# Patient Record
Sex: Male | Born: 1935 | Race: White | Hispanic: No | Marital: Married | Smoking: Former smoker
Health system: Southern US, Community
[De-identification: ages and names within clinical notes are randomized; demographics above are authoritative.]

## PROBLEM LIST (undated history)

## (undated) DIAGNOSIS — R0600 Dyspnea, unspecified: Secondary | ICD-10-CM

## (undated) DIAGNOSIS — R059 Cough, unspecified: Secondary | ICD-10-CM

## (undated) DIAGNOSIS — J189 Pneumonia, unspecified organism: Secondary | ICD-10-CM

## (undated) DIAGNOSIS — I10 Essential (primary) hypertension: Secondary | ICD-10-CM

## (undated) DIAGNOSIS — M1990 Unspecified osteoarthritis, unspecified site: Secondary | ICD-10-CM

## (undated) DIAGNOSIS — J45909 Unspecified asthma, uncomplicated: Secondary | ICD-10-CM

## (undated) DIAGNOSIS — J449 Chronic obstructive pulmonary disease, unspecified: Secondary | ICD-10-CM

## (undated) DIAGNOSIS — N189 Chronic kidney disease, unspecified: Secondary | ICD-10-CM

## (undated) DIAGNOSIS — C801 Malignant (primary) neoplasm, unspecified: Secondary | ICD-10-CM

## (undated) DIAGNOSIS — R05 Cough: Secondary | ICD-10-CM

## (undated) HISTORY — PX: HERNIA REPAIR: SHX51

## (undated) HISTORY — PX: EYE SURGERY: SHX253

## (undated) HISTORY — PX: COLON SURGERY: SHX602

## (undated) HISTORY — PX: PROSTATECTOMY: SHX69

---

## 1980-01-27 DIAGNOSIS — C189 Malignant neoplasm of colon, unspecified: Secondary | ICD-10-CM

## 1980-01-27 HISTORY — DX: Malignant neoplasm of colon, unspecified: C18.9

## 1998-01-26 DIAGNOSIS — C61 Malignant neoplasm of prostate: Secondary | ICD-10-CM

## 1998-01-26 HISTORY — DX: Malignant neoplasm of prostate: C61

## 2004-03-27 ENCOUNTER — Ambulatory Visit: Payer: Self-pay | Admitting: Gastroenterology

## 2004-06-05 ENCOUNTER — Ambulatory Visit: Payer: Self-pay | Admitting: Internal Medicine

## 2009-01-21 ENCOUNTER — Ambulatory Visit: Payer: Self-pay | Admitting: Surgery

## 2009-02-07 ENCOUNTER — Ambulatory Visit: Payer: Self-pay | Admitting: Surgery

## 2009-03-14 ENCOUNTER — Ambulatory Visit: Payer: Self-pay | Admitting: Internal Medicine

## 2009-03-16 ENCOUNTER — Ambulatory Visit: Payer: Self-pay | Admitting: Internal Medicine

## 2009-07-22 ENCOUNTER — Ambulatory Visit: Payer: Self-pay | Admitting: Gastroenterology

## 2011-03-09 DIAGNOSIS — J40 Bronchitis, not specified as acute or chronic: Secondary | ICD-10-CM | POA: Insufficient documentation

## 2012-08-17 DIAGNOSIS — N4340 Spermatocele of epididymis, unspecified: Secondary | ICD-10-CM | POA: Insufficient documentation

## 2013-01-04 DIAGNOSIS — E785 Hyperlipidemia, unspecified: Secondary | ICD-10-CM | POA: Insufficient documentation

## 2013-01-04 DIAGNOSIS — J449 Chronic obstructive pulmonary disease, unspecified: Secondary | ICD-10-CM | POA: Insufficient documentation

## 2013-01-04 DIAGNOSIS — J441 Chronic obstructive pulmonary disease with (acute) exacerbation: Secondary | ICD-10-CM | POA: Insufficient documentation

## 2013-01-04 DIAGNOSIS — H269 Unspecified cataract: Secondary | ICD-10-CM | POA: Insufficient documentation

## 2013-01-04 DIAGNOSIS — I129 Hypertensive chronic kidney disease with stage 1 through stage 4 chronic kidney disease, or unspecified chronic kidney disease: Secondary | ICD-10-CM | POA: Insufficient documentation

## 2013-05-11 DIAGNOSIS — N393 Stress incontinence (female) (male): Secondary | ICD-10-CM | POA: Insufficient documentation

## 2013-06-18 DIAGNOSIS — R739 Hyperglycemia, unspecified: Secondary | ICD-10-CM | POA: Insufficient documentation

## 2013-06-18 DIAGNOSIS — E118 Type 2 diabetes mellitus with unspecified complications: Secondary | ICD-10-CM | POA: Insufficient documentation

## 2013-06-18 DIAGNOSIS — C61 Malignant neoplasm of prostate: Secondary | ICD-10-CM | POA: Insufficient documentation

## 2013-12-18 DIAGNOSIS — M7701 Medial epicondylitis, right elbow: Secondary | ICD-10-CM | POA: Insufficient documentation

## 2014-10-22 ENCOUNTER — Ambulatory Visit
Admission: EM | Admit: 2014-10-22 | Discharge: 2014-10-22 | Disposition: A | Discharge status: Home / Self Care | Payer: Medicare Other | Attending: Family Medicine | Admitting: Family Medicine

## 2014-10-22 ENCOUNTER — Encounter: Payer: Self-pay | Admitting: Emergency Medicine

## 2014-10-22 DIAGNOSIS — T162XXA Foreign body in left ear, initial encounter: Secondary | ICD-10-CM | POA: Diagnosis not present

## 2014-10-22 HISTORY — DX: Essential (primary) hypertension: I10

## 2014-10-22 HISTORY — DX: Chronic obstructive pulmonary disease, unspecified: J44.9

## 2014-10-22 MED ORDER — NEOMYCIN-POLYMYXIN-HC 3.5-10000-1 OT SUSP: 4.0000 [drp] | Freq: Three times a day (TID) | OTIC | Status: DC

## 2014-10-22 NOTE — Discharge Instructions (Signed)
Ear Foreign Body An ear foreign body is an object that is stuck in the ear. It is common for young children to put objects into the ear canal. These may include pebbles, beads, beans, and any other small objects which will fit. In adults, objects such as cotton swabs may become lodged in the ear canal. In all ages, the most common foreign bodies are insects that enter the ear canal.  SYMPTOMS  Foreign bodies may cause pain, buzzing or roaring sounds, hearing loss, and ear drainage.  HOME CARE INSTRUCTIONS   Keep all follow-up appointments with your caregiver as told.  Keep small objects out of reach of young children. Tell them not to put anything in their ears. SEEK IMMEDIATE MEDICAL CARE IF:   You have bleeding from the ear.  You have increased pain or swelling of the ear.  You have reduced hearing.  You have discharge coming from the ear.  You have a fever.  You have a headache. MAKE SURE YOU:   Understand these instructions.  Will watch your condition.  Will get help right away if you are not doing well or get worse. Document Released: 01/10/2000 Document Revised: 04/06/2011 Document Reviewed: 08/31/2007 [facility] Patient Information 2015 [place], [place]. This information is not intended to replace advice given to you by your health care provider. Make sure you discuss any questions you have with your health care provider. Otitis Externa Otitis externa is a bacterial or fungal infection of the outer ear canal. This is the area from the eardrum to the outside of the ear. Otitis externa is sometimes called "swimmer's ear." CAUSES  Possible causes of infection include:  Swimming in dirty water.  Moisture remaining in the ear after swimming or bathing.  Mild injury (trauma) to the ear.  Objects stuck in the ear (foreign body).  Cuts or scrapes (abrasions) on the outside of the ear. SIGNS AND SYMPTOMS  The first symptom of infection is often itching in the ear canal. Later  signs and symptoms may include swelling and redness of the ear canal, ear pain, and yellowish-white fluid (pus) coming from the ear. The ear pain may be worse when pulling on the earlobe. DIAGNOSIS  Your health care provider will perform a physical exam. A sample of fluid may be taken from the ear and examined for bacteria or fungi. TREATMENT  Antibiotic ear drops are often given for 10 to 14 days. Treatment may also include pain medicine or corticosteroids to reduce itching and swelling. HOME CARE INSTRUCTIONS   Apply antibiotic ear drops to the ear canal as prescribed by your health care provider.  Take medicines only as directed by your health care provider.  If you have diabetes, follow any additional treatment instructions from your health care provider.  Keep all follow-up visits as directed by your health care provider. PREVENTION   Keep your ear dry. Use the corner of a towel to absorb water out of the ear canal after swimming or bathing.  Avoid scratching or putting objects inside your ear. This can damage the ear canal or remove the protective wax that lines the canal. This makes it easier for bacteria and fungi to grow.  Avoid swimming in lakes, polluted water, or poorly chlorinated pools.  You may use ear drops made of rubbing alcohol and vinegar after swimming. Combine equal parts of white vinegar and alcohol in a bottle. Put 3 or 4 drops into each ear after swimming. SEEK MEDICAL CARE IF:   You have a  fever.  Your ear is still red, swollen, painful, or draining pus after 3 days.  Your redness, swelling, or pain gets worse.  You have a severe headache.  You have redness, swelling, pain, or tenderness in the area behind your ear. MAKE SURE YOU:   Understand these instructions.  Will watch your condition.  Will get help right away if you are not doing well or get worse. Document Released: 01/12/2005 Document Revised: 05/29/2013 Document Reviewed:  01/29/2011 [facility] Patient Information 2015 [place], [place]. This information is not intended to replace advice given to you by your health care provider. Make sure you discuss any questions you have with your health care provider.

## 2014-10-22 NOTE — ED Provider Notes (Signed)
CSN: [id number]     Arrival date & time 10/22/14  1036 History   First MD Initiated Contact with Patient 10/22/14 1245     Chief Complaint  Patient presents with  . 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  Tried shower/shaking it out some rubbing alcohol on external ear without success hearing crackling left ear.  Denied ear discharge, fever, chills.  Patient is a 79 y.o. male presenting with foreign body in ear. The history is provided by the patient.  Foreign Body in Ear This is a new problem. The current episode started more than 2 days ago. The problem occurs constantly. The problem has not changed since onset.Pertinent negatives include no chest pain, no abdominal pain, no headaches and no shortness of breath. Nothing aggravates the symptoms. Nothing relieves the symptoms. He has tried food and water for the symptoms. The treatment provided no relief.    Past Medical History  Diagnosis Date  . Hypertension   . COPD (chronic obstructive pulmonary disease)    Past Surgical History  Procedure Laterality Date  . Prostatectomy    . Colon surgery     History reviewed. No pertinent family history. Social History  Substance Use Topics  . Smoking status: Former Research scientist (life sciences)  . Smokeless tobacco: Never Used  . Alcohol Use: Yes    Review of Systems  Constitutional: Negative for fever, chills, diaphoresis, activity change, appetite change, fatigue and unexpected weight change.  Eyes: Negative for photophobia, pain, discharge, redness, itching and visual disturbance.  Respiratory: Negative for shortness of breath, wheezing and stridor.   Cardiovascular: Negative for chest pain and leg swelling.  Gastrointestinal: Negative for abdominal pain and abdominal distention.  Endocrine: Negative for cold intolerance and  heat intolerance.  Genitourinary: Negative for dysuria.  Musculoskeletal: Negative for neck pain and neck stiffness.  Skin: Negative for color change, pallor, rash and wound.  Allergic/Immunologic: Positive for environmental allergies. Negative for food allergies.  Neurological: Negative for dizziness, tremors, seizures, syncope, facial asymmetry, speech difficulty, weakness, light-headedness, numbness and headaches.  Hematological: Negative for adenopathy. Does not bruise/bleed easily.  Psychiatric/Behavioral: Negative for behavioral problems, confusion, sleep disturbance and agitation.    Allergies  Review of patient's allergies indicates no known allergies.  Home Medications   Prior to Admission medications   Medication Sig Start Date End Date Taking? Authorizing Provider  albuterol (PROVENTIL HFA;VENTOLIN HFA) 108 (90 BASE) MCG/ACT inhaler Inhale 1 puff into the lungs every 6 (six) hours as needed for wheezing or shortness of breath.   Yes Historical Provider, MD  aspirin 81 MG tablet Take 81 mg by mouth daily.   Yes Historical Provider, MD  doxazosin (CARDURA) 8 MG tablet Take 8 mg by mouth daily.   Yes Historical Provider, MD  fluticasone (FLONASE) 50 MCG/ACT nasal spray Place 1 spray into both nostrils daily.   Yes Historical Provider, MD  lisinopril-hydrochlorothiazide (PRINZIDE,ZESTORETIC) 20-12.5 MG per tablet Take 2 tablets by mouth daily.   Yes Historical Provider, MD  simvastatin (ZOCOR) 40 MG tablet Take 40 mg by mouth daily.   Yes Historical Provider, MD  tiotropium (SPIRIVA) 18 MCG inhalation capsule Place 18 mcg into inhaler and inhale daily.   Yes Historical Provider, MD  traMADol (ULTRAM) 50 MG tablet Take 50 mg by mouth daily.   Yes Historical Provider, MD  traZODone (DESYREL) 50 MG tablet Take 50 mg by mouth at bedtime.  Yes Historical Provider, MD  neomycin-polymyxin-hydrocortisone (CORTISPORIN) 3.5-10000-1 otic suspension Place 4 drops into the left ear 3 (three)  times daily. 10/22/14   Olen Cordial, NP   Meds Ordered and Administered this Visit  Medications - No data to display  BP 161/81 mmHg  Pulse 70  Temp(Src) 97.1 F (36.2 C) (Tympanic)  Resp 16  Ht 5\' 10"  (1.778 m)  Wt 210 lb (95.255 kg)  BMI 30.13 kg/m2  SpO2 97% No data found.   Physical Exam  Constitutional: He is oriented to person, place, and time. Vital signs are normal. He appears well-developed and well-nourished. No distress.  HENT:  Head: Normocephalic and atraumatic.  Right Ear: Hearing, tympanic membrane, external ear and ear canal normal.  Left Ear: Hearing, tympanic membrane and external ear normal. A foreign body is present.  Ears:  Nose: Nose normal.  Mouth/Throat: Uvula is midline, oropharynx is clear and moist and mucous membranes are normal. Mucous membranes are not pale, not dry and not cyanotic. He does not have dentures. No oral lesions. No trismus in the jaw. Normal dentition. No dental abscesses, uvula swelling, lacerations or dental caries. No oropharyngeal exudate, posterior oropharyngeal edema, posterior oropharyngeal erythema or tonsillar abscesses.  Left external canal with fly at 6 oclock position distal canal adjacent to TM with feet up body down; attempted removal with ear curretage and lighted tweezers without success  Eyes: Conjunctivae, EOM and lids are normal. Pupils are equal, round, and reactive to light. Right eye exhibits no discharge. Left eye exhibits no discharge. No scleral icterus.  Neck: Trachea normal and normal range of motion. Neck supple. No tracheal deviation present.  Cardiovascular: Normal rate, regular rhythm and intact distal pulses.   Pulmonary/Chest: Effort normal and breath sounds normal. No stridor. No respiratory distress. He has no wheezes. He has no rales.  Abdominal: Soft. He exhibits no distension.  Musculoskeletal: Normal range of motion. He exhibits no edema or tenderness.  Neurological: He is alert and oriented to  person, place, and time. He exhibits normal muscle tone. Coordination normal.  Skin: Skin is warm, dry and intact. No rash noted. He is not diaphoretic. No erythema. No pallor.  Psychiatric: He has a normal mood and affect. His speech is normal and behavior is normal. Judgment and thought content normal. Cognition and memory are normal.  Nursing note and vitals reviewed.   ED Course  Procedures (including critical care time)  Labs Review Labs Reviewed - No data to display  Imaging Review No results found.  1250 Attempted insect removal with ear curretage and scoop without success at 6 oclock position adjacent to TM; order placed for ear irrigation by RN Tula Nakayama.  1305 Re-evaluated patient external canal after ear irrigation and insect no longer present;  TM slightly erythematous along with external canal.  Patient reported slight discomfort left ear after ear irrigation.  MDM   1. Foreign body in left ear, initial encounter    normal saline irrigation left ear with irrigation bottle.  No bleeding, slight discomfort and erythema noted external ear canal.  Patient given exitcare handout on ear foreign body and otitis externa.  Rx for cortisporin Otic given to patient may use tylenol 1000mg  po QID prn pain also.  Avoid qtips in ears  Follow up for re-evaluation if worsening symptoms, fever, ear discharge.  Patient verbalized understanding of information/instructions, agreed with plan of care and had no further questions at this time.    Olen Cordial, NP 10/22/14 1536

## 2014-10-22 NOTE — ED Notes (Signed)
Patient states that he thinks there is a bug in his left ear since Sunday night.

## 2017-07-23 ENCOUNTER — Other Ambulatory Visit: Payer: Self-pay | Admitting: Physician Assistant

## 2017-07-23 DIAGNOSIS — M25562 Pain in left knee: Secondary | ICD-10-CM

## 2017-07-28 ENCOUNTER — Ambulatory Visit
Admission: RE | Admit: 2017-07-28 | Discharge: 2017-07-28 | Disposition: A | Discharge status: Home / Self Care | Payer: Medicare Other | Source: Ambulatory Visit | Attending: Physician Assistant | Admitting: Physician Assistant

## 2017-07-28 DIAGNOSIS — M948X6 Other specified disorders of cartilage, lower leg: Secondary | ICD-10-CM | POA: Diagnosis not present

## 2017-07-28 DIAGNOSIS — X58XXXA Exposure to other specified factors, initial encounter: Secondary | ICD-10-CM | POA: Diagnosis not present

## 2017-07-28 DIAGNOSIS — S83242A Other tear of medial meniscus, current injury, left knee, initial encounter: Secondary | ICD-10-CM | POA: Insufficient documentation

## 2017-07-28 DIAGNOSIS — M25562 Pain in left knee: Secondary | ICD-10-CM | POA: Diagnosis present

## 2017-08-19 ENCOUNTER — Other Ambulatory Visit: Payer: Self-pay

## 2017-08-19 ENCOUNTER — Encounter
Admission: RE | Admit: 2017-08-19 | Discharge: 2017-08-19 | Disposition: A | Discharge status: Home / Self Care | Payer: Medicare Other | Source: Ambulatory Visit | Attending: Orthopedic Surgery | Admitting: Orthopedic Surgery

## 2017-08-19 DIAGNOSIS — I10 Essential (primary) hypertension: Secondary | ICD-10-CM | POA: Insufficient documentation

## 2017-08-19 DIAGNOSIS — M25562 Pain in left knee: Secondary | ICD-10-CM | POA: Insufficient documentation

## 2017-08-19 DIAGNOSIS — Z01818 Encounter for other preprocedural examination: Secondary | ICD-10-CM | POA: Diagnosis not present

## 2017-08-19 HISTORY — DX: Unspecified osteoarthritis, unspecified site: M19.90

## 2017-08-19 HISTORY — DX: Cough, unspecified: R05.9

## 2017-08-19 HISTORY — DX: Malignant (primary) neoplasm, unspecified: C80.1

## 2017-08-19 HISTORY — DX: Cough: R05

## 2017-08-19 HISTORY — DX: Chronic kidney disease, unspecified: N18.9

## 2017-08-19 LAB — POTASSIUM: Potassium: 4.7 mmol/L (ref 3.5–5.1)

## 2017-08-19 LAB — CBC
HEMATOCRIT: 40.9 % (ref 40.0–52.0)
Hemoglobin: 14.2 g/dL (ref 13.0–18.0)
MCH: 32 pg (ref 26.0–34.0)
MCHC: 34.7 g/dL (ref 32.0–36.0)
MCV: 92.3 fL (ref 80.0–100.0)
PLATELETS: 314 10*3/uL (ref 150–440)
RBC: 4.43 MIL/uL (ref 4.40–5.90)
RDW: 12.6 % (ref 11.5–14.5)
WBC: 10.9 10*3/uL — AB (ref 3.8–10.6)

## 2017-08-19 NOTE — Patient Instructions (Signed)
Your procedure is scheduled on: 08/27/17 Report to Day [facility]LO[facility] To find out your arrival time please call [phone] between 1PM - 3PM on 08/26/17 Remember: Instructions that are not followed completely may result in serious medical risk,  up to and including death, or upon the discretion of your surgeon and anesthesiologist your  surgery may need to be rescheduled.     _X__ 1. Do not eat food after midnight the night before your procedure.                 No gum chewing or hard candies. You may drink clear liquids up to 2 hours                 before you are scheduled to arrive for your surgery- DO not drink clear                 liquids within 2 hours of the start of your surgery.                 Clear Liquids include:  water, apple juice without pulp, clear carbohydrate                 drink such as Clearfast of Gatorade, Black Coffee or Tea (Do not add                 anything to coffee or tea).  __X__2.  On the morning of surgery brush your teeth with toothpaste and water, you                may rinse your mouth with mouthwash if you wish.  Do not swallow any toothpaste of mouthwash.     _X__ 3.  No Alcohol for 24 hours before or after surgery.   _X__ 4.  Do Not Smoke or use e-cigarettes For 24 Hours Prior to Your Surgery.                 Do not use any chewable tobacco products for at least 6 hours prior to                 surgery.  ____  5.  Bring all medications with you on the day of surgery if instructed.   ____  6.  Notify your doctor if there is any change in your medical condition      (cold, fever, infections).     Do not wear jewelry, make-up, hairpins, clips or nail polish. Do not wear lotions, powders, or perfumes. You may wear deodorant. Do not shave 48 hours prior to surgery. Men may shave face and neck. Do not bring valuables to the hospital.    [facility] is not responsible for any belongings or  valuables.  Contacts, dentures or bridgework may not be worn into surgery. Leave your suitcase in the car. After surgery it may be brought to your room. For patients admitted to the hospital, discharge time is determined by your treatment team.   Patients discharged the day of surgery will not be allowed to drive home.   Please read over the following fact sheets that you were given:   Surgical Site Infection Prevention   ____ Take these medicines the morning of surgery with A SIP OF WATER:    1.NONE  2.   3.   4.  5.  6.  ____ Fleet Enema (as directed)   __X__ Use CHG Soap as directed  ____ Use  inhalers on the day of surgery  ____ Stop metformin 2 days prior to surgery    ____ Take 1/2 of usual insulin dose the night before surgery. No insulin the morning          of surgery.   __X__ Stop Coumadin/Plavix/aspirin on   STOP ASPIRIN TODAY UNTIL AFTER SURGERY  ____ Stop Anti-inflammatories on    ____ Stop supplements until after surgery.    ____ Bring C-Pap to the hospital.

## 2017-08-27 ENCOUNTER — Encounter
Admission: RE | Disposition: A | Discharge status: Home / Self Care | Payer: Self-pay | Source: Ambulatory Visit | Attending: Orthopedic Surgery

## 2017-08-27 ENCOUNTER — Ambulatory Visit
Admission: RE | Admit: 2017-08-27 | Discharge: 2017-08-27 | Disposition: A | Discharge status: Home / Self Care | Payer: Medicare Other | Source: Ambulatory Visit | Attending: Orthopedic Surgery | Admitting: Orthopedic Surgery

## 2017-08-27 ENCOUNTER — Ambulatory Visit: Payer: Medicare Other | Admitting: Anesthesiology

## 2017-08-27 ENCOUNTER — Encounter: Payer: Self-pay | Admitting: Orthopedic Surgery

## 2017-08-27 DIAGNOSIS — Z87891 Personal history of nicotine dependence: Secondary | ICD-10-CM | POA: Diagnosis not present

## 2017-08-27 DIAGNOSIS — I10 Essential (primary) hypertension: Secondary | ICD-10-CM | POA: Diagnosis not present

## 2017-08-27 DIAGNOSIS — Z85038 Personal history of other malignant neoplasm of large intestine: Secondary | ICD-10-CM | POA: Insufficient documentation

## 2017-08-27 DIAGNOSIS — M1990 Unspecified osteoarthritis, unspecified site: Secondary | ICD-10-CM | POA: Diagnosis not present

## 2017-08-27 DIAGNOSIS — Z7951 Long term (current) use of inhaled steroids: Secondary | ICD-10-CM | POA: Diagnosis not present

## 2017-08-27 DIAGNOSIS — Z9889 Other specified postprocedural states: Secondary | ICD-10-CM

## 2017-08-27 DIAGNOSIS — Z7982 Long term (current) use of aspirin: Secondary | ICD-10-CM | POA: Diagnosis not present

## 2017-08-27 DIAGNOSIS — Z859 Personal history of malignant neoplasm, unspecified: Secondary | ICD-10-CM | POA: Insufficient documentation

## 2017-08-27 DIAGNOSIS — Z9049 Acquired absence of other specified parts of digestive tract: Secondary | ICD-10-CM | POA: Insufficient documentation

## 2017-08-27 DIAGNOSIS — Z79899 Other long term (current) drug therapy: Secondary | ICD-10-CM | POA: Diagnosis not present

## 2017-08-27 DIAGNOSIS — J45909 Unspecified asthma, uncomplicated: Secondary | ICD-10-CM | POA: Insufficient documentation

## 2017-08-27 DIAGNOSIS — Z8546 Personal history of malignant neoplasm of prostate: Secondary | ICD-10-CM | POA: Diagnosis not present

## 2017-08-27 DIAGNOSIS — E785 Hyperlipidemia, unspecified: Secondary | ICD-10-CM | POA: Insufficient documentation

## 2017-08-27 DIAGNOSIS — M23222 Derangement of posterior horn of medial meniscus due to old tear or injury, left knee: Secondary | ICD-10-CM | POA: Insufficient documentation

## 2017-08-27 DIAGNOSIS — Z9841 Cataract extraction status, right eye: Secondary | ICD-10-CM | POA: Insufficient documentation

## 2017-08-27 DIAGNOSIS — M94262 Chondromalacia, left knee: Secondary | ICD-10-CM | POA: Diagnosis not present

## 2017-08-27 DIAGNOSIS — J449 Chronic obstructive pulmonary disease, unspecified: Secondary | ICD-10-CM | POA: Insufficient documentation

## 2017-08-27 DIAGNOSIS — S83207A Unspecified tear of unspecified meniscus, current injury, left knee, initial encounter: Secondary | ICD-10-CM | POA: Diagnosis present

## 2017-08-27 HISTORY — PX: KNEE ARTHROSCOPY: SHX127

## 2017-08-27 SURGERY — ARTHROSCOPY, KNEE
Anesthesia: General | Site: Knee | Laterality: Left | Wound class: "Clean "

## 2017-08-27 MED ORDER — PROPOFOL 10 MG/ML IV BOLUS
INTRAVENOUS | Status: DC | PRN
  Administered 2017-08-27: 200 mg via INTRAVENOUS

## 2017-08-27 MED ORDER — MORPHINE SULFATE (PF) 4 MG/ML IV SOLN
INTRAVENOUS | Status: AC
  Filled 2017-08-27: qty 1

## 2017-08-27 MED ORDER — FAMOTIDINE 20 MG PO TABS
ORAL_TABLET | ORAL | Status: AC
  Administered 2017-08-27: 20 mg via ORAL
  Filled 2017-08-27: qty 1

## 2017-08-27 MED ORDER — HYDROCODONE-ACETAMINOPHEN 5-325 MG PO TABS: 1.0000 | ORAL_TABLET | ORAL | 0 refills | Status: DC | PRN

## 2017-08-27 MED ORDER — ALBUTEROL SULFATE HFA 108 (90 BASE) MCG/ACT IN AERS
INHALATION_SPRAY | RESPIRATORY_TRACT | Status: DC | PRN
  Administered 2017-08-27: 5 via RESPIRATORY_TRACT

## 2017-08-27 MED ORDER — ACETAMINOPHEN 10 MG/ML IV SOLN
INTRAVENOUS | Status: DC | PRN
  Administered 2017-08-27: 1000 mg via INTRAVENOUS

## 2017-08-27 MED ORDER — BUPIVACAINE-EPINEPHRINE 0.25% -1:200000 IJ SOLN
INTRAMUSCULAR | Status: DC | PRN
  Administered 2017-08-27: 30 mL

## 2017-08-27 MED ORDER — ONDANSETRON HCL 4 MG/2ML IJ SOLN: 4.0000 mg | Freq: Once | INTRAMUSCULAR | Status: DC | PRN

## 2017-08-27 MED ORDER — ACETAMINOPHEN 10 MG/ML IV SOLN
INTRAVENOUS | Status: AC
  Filled 2017-08-27: qty 100

## 2017-08-27 MED ORDER — LIDOCAINE 2% (20 MG/ML) 5 ML SYRINGE
INTRAMUSCULAR | Status: DC | PRN
  Administered 2017-08-27: 100 mg via INTRAVENOUS

## 2017-08-27 MED ORDER — EPHEDRINE SULFATE 50 MG/ML IJ SOLN
INTRAMUSCULAR | Status: DC | PRN
  Administered 2017-08-27: 10 mg via INTRAVENOUS
  Administered 2017-08-27: 5 mg via INTRAVENOUS

## 2017-08-27 MED ORDER — DEXAMETHASONE SODIUM PHOSPHATE 10 MG/ML IJ SOLN
INTRAMUSCULAR | Status: AC
  Filled 2017-08-27: qty 1

## 2017-08-27 MED ORDER — ONDANSETRON HCL 4 MG/2ML IJ SOLN
INTRAMUSCULAR | Status: AC
  Filled 2017-08-27: qty 2

## 2017-08-27 MED ORDER — FENTANYL CITRATE (PF) 100 MCG/2ML IJ SOLN: 25.0000 ug | INTRAMUSCULAR | Status: DC | PRN

## 2017-08-27 MED ORDER — FENTANYL CITRATE (PF) 100 MCG/2ML IJ SOLN
INTRAMUSCULAR | Status: AC
Start: 2017-08-27 — End: ?
  Filled 2017-08-27: qty 2

## 2017-08-27 MED ORDER — FAMOTIDINE 20 MG PO TABS
20.0000 mg | ORAL_TABLET | Freq: Once | ORAL | Status: AC
  Administered 2017-08-27: 20 mg via ORAL

## 2017-08-27 MED ORDER — PROPOFOL 500 MG/50ML IV EMUL
INTRAVENOUS | Status: AC
  Filled 2017-08-27: qty 50

## 2017-08-27 MED ORDER — MORPHINE SULFATE 4 MG/ML IJ SOLN
INTRAMUSCULAR | Status: DC | PRN
  Administered 2017-08-27: 4 mg

## 2017-08-27 MED ORDER — ONDANSETRON HCL 4 MG/2ML IJ SOLN
INTRAMUSCULAR | Status: DC | PRN
  Administered 2017-08-27: 4 mg via INTRAVENOUS

## 2017-08-27 MED ORDER — GLYCOPYRROLATE 0.2 MG/ML IJ SOLN
INTRAMUSCULAR | Status: DC | PRN
  Administered 2017-08-27: 0.2 mg via INTRAVENOUS

## 2017-08-27 MED ORDER — GLYCOPYRROLATE 0.2 MG/ML IJ SOLN
INTRAMUSCULAR | Status: AC
  Filled 2017-08-27: qty 1

## 2017-08-27 MED ORDER — BUPIVACAINE-EPINEPHRINE (PF) 0.25% -1:200000 IJ SOLN
INTRAMUSCULAR | Status: AC
  Filled 2017-08-27: qty 30

## 2017-08-27 MED ORDER — SODIUM CHLORIDE 0.9 % IV SOLN
INTRAVENOUS | Status: DC
  Administered 2017-08-27: 07:00:00 via INTRAVENOUS

## 2017-08-27 MED ORDER — LIDOCAINE HCL (PF) 2 % IJ SOLN
INTRAMUSCULAR | Status: AC
  Filled 2017-08-27: qty 10

## 2017-08-27 MED ORDER — DEXAMETHASONE SODIUM PHOSPHATE 10 MG/ML IJ SOLN
INTRAMUSCULAR | Status: DC | PRN
  Administered 2017-08-27: 10 mg via INTRAVENOUS

## 2017-08-27 MED ORDER — FENTANYL CITRATE (PF) 100 MCG/2ML IJ SOLN
INTRAMUSCULAR | Status: DC | PRN
  Administered 2017-08-27 (×2): 50 ug via INTRAVENOUS

## 2017-08-27 MED ORDER — CHLORHEXIDINE GLUCONATE 4 % EX LIQD: 60.0000 mL | Freq: Once | CUTANEOUS | Status: DC

## 2017-08-27 MED ORDER — SODIUM CHLORIDE 0.9 % IV SOLN
INTRAVENOUS | Status: DC | PRN
  Administered 2017-08-27: 5 mL/h via INTRAVENOUS

## 2017-08-27 MED ORDER — SEVOFLURANE IN SOLN
RESPIRATORY_TRACT | Status: AC
  Filled 2017-08-27: qty 250

## 2017-08-27 MED ORDER — PHENYLEPHRINE 40 MCG/ML (10ML) SYRINGE FOR IV PUSH (FOR BLOOD PRESSURE SUPPORT)
PREFILLED_SYRINGE | INTRAVENOUS | Status: DC | PRN
  Administered 2017-08-27 (×3): 80 ug via INTRAVENOUS
  Administered 2017-08-27: 40 ug via INTRAVENOUS

## 2017-08-27 SURGICAL SUPPLY — 26 items
BLADE SHAVER 4.5 DBL SERAT CV (CUTTER) IMPLANT
BLADE SHAVER TORPEDO 4X13 (MISCELLANEOUS) ×1 IMPLANT
CUFF TOURN 24 STER (MISCELLANEOUS) IMPLANT
CUFF TOURN 30 STER DUAL PORT (MISCELLANEOUS) ×1 IMPLANT
DRSG DERMACEA 8X12 NADH (GAUZE/BANDAGES/DRESSINGS) ×2 IMPLANT
DURAPREP 26ML APPLICATOR (WOUND CARE) ×4 IMPLANT
DW OUTFLOW CASSETTE/TUBE SET (MISCELLANEOUS) ×1 IMPLANT
GAUZE SPONGE 4X4 12PLY STRL (GAUZE/BANDAGES/DRESSINGS) ×2 IMPLANT
GLOVE BIOGEL M STRL SZ7.5 (GLOVE) ×2 IMPLANT
GLOVE INDICATOR 8.0 STRL GRN (GLOVE) ×2 IMPLANT
GOWN STRL REUS W/ TWL LRG LVL3 (GOWN DISPOSABLE) ×2 IMPLANT
GOWN STRL REUS W/TWL LRG LVL3 (GOWN DISPOSABLE) ×2
IV LACTATED RINGER IRRG 3000ML (IV SOLUTION) ×6
IV LR IRRIG 3000ML ARTHROMATIC (IV SOLUTION) ×6 IMPLANT
KIT TURNOVER KIT A (KITS) ×2 IMPLANT
MANIFOLD NEPTUNE II (INSTRUMENTS) ×2 IMPLANT
PACK ARTHROSCOPY KNEE (MISCELLANEOUS) ×2 IMPLANT
SET TUBE SUCT SHAVER OUTFL 24K (TUBING) ×1 IMPLANT
SET TUBE TIP INTRA-ARTICULAR (MISCELLANEOUS) ×1 IMPLANT
SUT ETHILON 3-0 FS-10 30 BLK (SUTURE) ×2
SUTURE EHLN 3-0 FS-10 30 BLK (SUTURE) ×1 IMPLANT
TUBING ARTHRO INFLOW-ONLY STRL (TUBING) ×1 IMPLANT
TUBING REDEUCE PAT W/CON 8IN (MISCELLANEOUS) ×1 IMPLANT
TUBING REDEUCE PUMP W/CON 8IN (MISCELLANEOUS) ×1 IMPLANT
WAND HAND CNTRL MULTIVAC 50 (MISCELLANEOUS) ×1 IMPLANT
WRAP KNEE W/COLD PACKS 25.5X14 (SOFTGOODS) ×2 IMPLANT

## 2017-08-27 NOTE — Anesthesia Post-op Follow-up Note (Signed)
Anesthesia QCDR form completed.        

## 2017-08-27 NOTE — H&P (Signed)
The patient has been re-examined, and the chart reviewed, and there have been no interval changes to the documented history and physical.    The risks, benefits, and alternatives have been discussed at length. The patient expressed understanding of the risks benefits and agreed with plans for surgical intervention.  Callie Facey P. Denielle Bayard, Jr. M.D.    

## 2017-08-27 NOTE — Anesthesia Procedure Notes (Signed)
Procedure Name: LMA Insertion Date/Time: 08/27/2017 7:51 AM Performed by: Marsh Dolly, CRNA Pre-anesthesia Checklist: Patient identified, Patient being monitored, Timeout performed, Emergency Drugs available and Suction available Patient Re-evaluated:Patient Re-evaluated prior to induction Oxygen Delivery Method: Circle system utilized Preoxygenation: Pre-oxygenation with 100% oxygen Induction Type: IV induction Ventilation: Mask ventilation without difficulty LMA: LMA inserted LMA Size: 5.0 Tube type: Oral Number of attempts: 1 Placement Confirmation: positive ETCO2 and breath sounds checked- equal and bilateral Tube secured with: Tape Dental Injury: Teeth and Oropharynx as per pre-operative assessment

## 2017-08-27 NOTE — Anesthesia Preprocedure Evaluation (Signed)
Anesthesia Evaluation  Patient identified by MRN, date of birth, ID band Patient awake    Reviewed: Allergy & Precautions, H&P , NPO status , Patient's Chart, lab work & pertinent test results, reviewed documented beta blocker date and time   Airway Mallampati: II  TM Distance: >3 FB Neck ROM: full    Dental  (+) Teeth Intact   Pulmonary neg pulmonary ROS, COPD,  COPD inhaler, former smoker,  Chronic cough..nonproductive.  JA   Pulmonary exam normal        Cardiovascular Exercise Tolerance: Good hypertension, On Medications negative cardio ROS Normal cardiovascular exam Rate:Normal     Neuro/Psych negative neurological ROS  negative psych ROS   GI/Hepatic negative GI ROS, Neg liver ROS,   Endo/Other  negative endocrine ROS  Renal/GU Renal diseasenegative Renal ROS  negative genitourinary   Musculoskeletal   Abdominal   Peds  Hematology negative hematology ROS (+)   Anesthesia Other Findings   Reproductive/Obstetrics negative OB ROS                             Anesthesia Physical Anesthesia Plan  ASA: III  Anesthesia Plan: General LMA   Post-op Pain Management:    Induction:   PONV Risk Score and Plan:   Airway Management Planned:   Additional Equipment:   Intra-op Plan:   Post-operative Plan:   Informed Consent: I have reviewed the patients History and Physical, chart, labs and discussed the procedure including the risks, benefits and alternatives for the proposed anesthesia with the patient or authorized representative who has indicated his/her understanding and acceptance.     Plan Discussed with: CRNA  Anesthesia Plan Comments:         Anesthesia Quick Evaluation

## 2017-08-27 NOTE — Progress Notes (Signed)
Left knee elevated on pillows   Pedal pulse positive to left foot  Can wiggle left extremity   Capillary refill positive to left foot  Color good

## 2017-08-27 NOTE — Discharge Instructions (Signed)
°  Instructions after Knee Arthroscopy  ° ° Jakiyah Stepney P. Aanshi Batchelder, Jr., M.D.    ° Dept. of Orthopaedics & Sports Medicine ° [facility] ° [address] ° [place], [place]  [address] ° ° Phone: [phone]   Fax: [phone] ° ° °DIET: °• Drink plenty of non-alcoholic fluids & begin a light diet. °• Resume your normal diet the day after surgery. ° °ACTIVITY:  °• You may use crutches or a walker with weight-bearing as tolerated, unless instructed otherwise. °• You may wean yourself off of the walker or crutches as tolerated.  °• Begin doing gentle exercises. Exercising will reduce the pain and swelling, increase motion, and prevent muscle weakness.   °• Avoid strenuous activities or athletics for a minimum of 4-6 weeks after arthroscopic surgery. °• Do not drive or operate any equipment until instructed. ° °WOUND CARE:  °• Place one to two pillows under the knee the first day or two when sitting or lying.  °• Continue to use the ice packs periodically to reduce pain and swelling. °• The small incisions in your knee are closed with nylon stitches. The stitches will be removed in the office. °• The bulky dressing may be removed on the second day after surgery. DO NOT TOUCH THE STITCHES. Put a Band-Aid over each stitch. Do NOT use any ointments or creams on the incisions.  °• You may bathe or shower after the stitches are removed at the first office visit following surgery. ° °MEDICATIONS: °• You may resume your regular medications. °• Please take the pain medication as prescribed. °• Do not take pain medication on an empty stomach. °• Do not drive or drink alcoholic beverages when taking pain medications. ° °CALL THE OFFICE FOR: °• Temperature above 101 degrees °• Excessive bleeding or drainage on the dressing. °• Excessive swelling, coldness, or paleness of the toes. °• Persistent nausea and vomiting. ° °FOLLOW-UP:  °• You should have an appointment to return to the office in 7-10 days after surgery.  °  °

## 2017-08-27 NOTE — Anesthesia Postprocedure Evaluation (Signed)
Anesthesia Post Note  Patient: TODDY BOYD  Procedure(s) Performed: ARTHROSCOPY KNEE  PARTIAL MEDIAL MENISECTOMY CHONDROPLASTY (Left Knee)  Patient location during evaluation: PACU Anesthesia Type: General Level of consciousness: awake and alert Pain management: pain level controlled Vital Signs Assessment: post-procedure vital signs reviewed and stable Respiratory status: spontaneous breathing, nonlabored ventilation, respiratory function stable and patient connected to nasal cannula oxygen Cardiovascular status: blood pressure returned to baseline and stable Postop Assessment: no apparent nausea or vomiting Anesthetic complications: no     Last Vitals:  Vitals:   08/27/17 0946 08/27/17 1022  BP: (!) 165/67 (!) 153/67  Pulse: 87 88  Resp: 16   Temp: (!) 36.2 C   SpO2: 96% 97%    Last Pain:  Vitals:   08/27/17 1022  TempSrc:   PainSc: 0-No pain                 Molli Barrows

## 2017-08-27 NOTE — Op Note (Signed)
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   ASSISTANT: none  ANESTHESIA: general  ESTIMATED BLOOD LOSS: Minimal  FLUIDS REPLACED: 700 mL of crystalloid  TOURNIQUET TIME: Not used  INDICATIONS FOR SURGERY: Scott Branch is an 82 y.o. year old male who has been seen for complaints of left knee pain. MRI demonstrated findings consistent with meniscal pathology. After discussion of the risks and benefits of surgical intervention, the patient expressed understanding of the risks benefits and agree with plans for left knee arthroscopy.   PROCEDURE IN DETAIL: The patient was brought into the operating room and, after adequate general anesthesia was achieved, a tourniquet was applied to the left thigh and the leg was placed in the leg holder. All bony prominences were well padded. The patient's left knee was cleaned and prepped with alcohol and Duraprep and draped in the usual sterile fashion. A "timeout" was performed as per usual protocol. The anticipated portal sites were injected with 0.25% Marcaine with epinephrine. An anterolateral incision was made and a cannula was inserted. A small effusion was evacuated and the knee was distended with fluid using the pump. The scope was advanced down the medial gutter into the medial compartment. Under visualization with the scope, an anteromedial portal was created and a hooked probe was inserted. The medial meniscus was visualized and probed.  There was a complex tear of  the posterior horn of the medial meniscus.  The tear was debrided using meniscal punches and a 4.5 mm shaver.  Final contouring was performed using a 50 degree wand.  The remaining rim of meniscus was visualized and probed and felt to be stable.  The articular cartilage was visualized.  There were grade 4 changes of chondromalacia involving the medial aspect of the medial tibial plateau and lesser changes to the medial femoral condyle.  These areas were debrided and contoured using the 50 degree wand.  The scope was then advanced into the intercondylar notch. The anterior cruciate ligament was visualized and probed and felt to be intact. The scope was removed from the lateral portal and reinserted via the anteromedial portal to better visualize the lateral compartment. The lateral meniscus was visualized and probed.  The lateral meniscus was intact without evidence of tear or instability.  The articular cartilage of the lateral compartment was visualized and noted to be in good condition.  Finally, the scope was advanced so as to visualize the patellofemoral articulation. Good patellar tracking was appreciated.  There were grade 3 changes involving the trochlear groove.  These areas were debrided and contoured using the 50 degree wand.  The knee was irrigated with copius amounts of fluid and suctioned dry. The anterolateral portal was re-approximated with #3-0 nylon. A combination of 0.25% Marcaine with epinephrine and 4 mg of Morphine were injected via the scope. The scope was removed and the anteromedial portal was re-approximated with #3-0 nylon. A sterile dressing was applied followed by application of an ice wrap.  The patient tolerated the procedure well and was transported to the  PACU in stable condition.  Scott Branch., M.D.

## 2017-08-27 NOTE — Transfer of Care (Signed)
Immediate Anesthesia Transfer of Care Note  Patient: Scott Branch  Procedure(s) Performed: ARTHROSCOPY KNEE  PARTIAL MEDIAL MENISECTOMY CHONDROPLASTY (Left Knee)  Patient Location: PACU  Anesthesia Type:General  Level of Consciousness: awake, alert  and oriented  Airway & Oxygen Therapy: Patient Spontanous Breathing and Patient connected to face mask oxygen  Post-op Assessment: Report given to RN and Post -op Vital signs reviewed and stable  Post vital signs: Reviewed and stable  Last Vitals:  Vitals Value Taken Time  BP    Temp    Pulse 93 08/27/2017  8:49 AM  Resp 20 08/27/2017  8:49 AM  SpO2 98 % 08/27/2017  8:49 AM  Vitals shown include unvalidated device data.  Last Pain:  Vitals:   08/27/17 0610  TempSrc: Oral         Complications: No apparent anesthesia complications

## 2017-11-03 ENCOUNTER — Other Ambulatory Visit: Payer: Self-pay | Admitting: Internal Medicine

## 2017-11-03 DIAGNOSIS — M25511 Pain in right shoulder: Secondary | ICD-10-CM

## 2017-11-03 DIAGNOSIS — M75121 Complete rotator cuff tear or rupture of right shoulder, not specified as traumatic: Secondary | ICD-10-CM

## 2017-11-07 DIAGNOSIS — M1712 Unilateral primary osteoarthritis, left knee: Secondary | ICD-10-CM | POA: Insufficient documentation

## 2017-11-24 ENCOUNTER — Ambulatory Visit
Admission: RE | Admit: 2017-11-24 | Discharge: 2017-11-24 | Disposition: A | Discharge status: Home / Self Care | Payer: Medicare Other | Source: Ambulatory Visit | Attending: Internal Medicine | Admitting: Internal Medicine

## 2017-11-24 DIAGNOSIS — M75121 Complete rotator cuff tear or rupture of right shoulder, not specified as traumatic: Secondary | ICD-10-CM | POA: Diagnosis present

## 2017-11-24 DIAGNOSIS — M25511 Pain in right shoulder: Secondary | ICD-10-CM

## 2017-11-24 DIAGNOSIS — M19011 Primary osteoarthritis, right shoulder: Secondary | ICD-10-CM | POA: Diagnosis not present

## 2018-01-04 ENCOUNTER — Inpatient Hospital Stay: Admission: RE | Admit: 2018-01-04 | Payer: Medicare Other | Source: Ambulatory Visit

## 2018-01-06 ENCOUNTER — Encounter
Admission: RE | Admit: 2018-01-06 | Discharge: 2018-01-06 | Disposition: A | Discharge status: Home / Self Care | Payer: Medicare Other | Source: Ambulatory Visit | Attending: Surgery | Admitting: Surgery

## 2018-01-06 ENCOUNTER — Other Ambulatory Visit: Payer: Self-pay

## 2018-01-06 DIAGNOSIS — Z01812 Encounter for preprocedural laboratory examination: Secondary | ICD-10-CM | POA: Diagnosis not present

## 2018-01-06 LAB — CBC
HCT: 40.5 % (ref 39.0–52.0)
Hemoglobin: 13.2 g/dL (ref 13.0–17.0)
MCH: 30.6 pg (ref 26.0–34.0)
MCHC: 32.6 g/dL (ref 30.0–36.0)
MCV: 94 fL (ref 80.0–100.0)
NRBC: 0 % (ref 0.0–0.2)
PLATELETS: 375 10*3/uL (ref 150–400)
RBC: 4.31 MIL/uL (ref 4.22–5.81)
RDW: 12.3 % (ref 11.5–15.5)
WBC: 9.6 10*3/uL (ref 4.0–10.5)

## 2018-01-06 LAB — BASIC METABOLIC PANEL
Anion gap: 10 (ref 5–15)
BUN: 26 mg/dL — ABNORMAL HIGH (ref 8–23)
CO2: 23 mmol/L (ref 22–32)
Calcium: 9.1 mg/dL (ref 8.9–10.3)
Chloride: 103 mmol/L (ref 98–111)
Creatinine, Ser: 1.36 mg/dL — ABNORMAL HIGH (ref 0.61–1.24)
GFR calc non Af Amer: 49 mL/min — ABNORMAL LOW (ref >60)
GFR, EST AFRICAN AMERICAN: 57 mL/min — AB (ref >60)
GLUCOSE: 102 mg/dL — AB (ref 70–99)
POTASSIUM: 3.8 mmol/L (ref 3.5–5.1)
SODIUM: 136 mmol/L (ref 135–145)

## 2018-01-06 NOTE — Patient Instructions (Signed)
Your procedure is scheduled on: Thursday 01/13/18.  Report to DAY SURGERY DEPARTMENT LOCATED ON 2ND FLOOR MEDICAL MALL ENTRANCE. To find out your arrival time please call [phone] between 1PM - 3PM on Wednesday 01/12/18.  Remember: Instructions that are not followed completely may result in serious medical risk, up to and including death, or upon the discretion of your surgeon and anesthesiologist your surgery may need to be rescheduled.       _X__ 1. Do not eat food after midnight the night before your procedure.                 No gum chewing or hard candies. You may drink clear liquids up to 2 hours                 before you are scheduled to arrive for your surgery- DO NOT drink clear                 liquids within 2 hours of the start of your surgery.                 Clear Liquids include:  water, apple juice without pulp, clear carbohydrate                 drink such as Clearfast or Gatorade, Black Coffee or Tea (Do not add                 anything to coffee or tea).    __X__2.  On the morning of surgery brush your teeth with toothpaste and water, you may rinse your mouth with mouthwash if you wish.  Do not swallow any toothpaste or mouthwash.       _X__ 3.  No Alcohol for 24 hours before or after surgery.    __X__  6.  Notify your doctor if there is any change in your medical condition      (cold, fever, infections).     Do not wear jewelry, make-up, hairpins, clips or nail polish. Do not wear lotions, powders, or perfumes.  Do not wear deodorant. Do not shave 48 hours prior to surgery. Men may shave face and neck. Do not bring valuables to the hospital.    [facility] is not responsible for any belongings or valuables.     Please read over the following fact sheets that you were given:   MRSA Information    __X__ Take these medicines the morning of surgery with A SIP OF WATER:     1. acetaminophen (TYLENOL) 650 MG CR tablet  2. albuterol (PROVENTIL  HFA;VENTOLIN HFA) 108 (90 BASE) MCG/ACT inhaler  3. fluticasone (FLONASE) 50 MCG/ACT nasal spray  4. Tiotropium Bromide-Olodaterol (STIOLTO RESPIMAT) 2.5-2.5 MCG/ACT AERS  5. traMADol (ULTRAM) 50 MG tablet if needed     __X__ Use CHG Soap as directed   _ X___ Use inhalers on the day of surgery. Also bring the inhaler with you to the hospital on the morning of surgery.   __X__ Stop Anti-inflammatories 7 days before surgery such as Advil, Ibuprofen, Motrin, BC or Goodies Powder, Naprosyn, Naproxen, Aleve, Aspirin, Meloxicam. Last dose today. May take Tylenol if needed for pain or discomfort.    __X__ Stop the following herbal supplements today: CINNAMON PO,

## 2018-01-12 MED ORDER — CEFAZOLIN SODIUM-DEXTROSE 2-4 GM/100ML-% IV SOLN
2.0000 g | Freq: Once | INTRAVENOUS | Status: AC
  Administered 2018-01-13: 2 g via INTRAVENOUS

## 2018-01-13 ENCOUNTER — Ambulatory Visit: Payer: Medicare Other | Admitting: Certified Registered Nurse Anesthetist

## 2018-01-13 ENCOUNTER — Ambulatory Visit
Admission: RE | Admit: 2018-01-13 | Discharge: 2018-01-13 | Disposition: A | Discharge status: Home / Self Care | Payer: Medicare Other | Source: Ambulatory Visit | Attending: Surgery | Admitting: Surgery

## 2018-01-13 ENCOUNTER — Encounter
Admission: RE | Disposition: A | Discharge status: Home / Self Care | Payer: Self-pay | Source: Ambulatory Visit | Attending: Surgery

## 2018-01-13 ENCOUNTER — Other Ambulatory Visit: Payer: Self-pay

## 2018-01-13 DIAGNOSIS — Z9841 Cataract extraction status, right eye: Secondary | ICD-10-CM | POA: Insufficient documentation

## 2018-01-13 DIAGNOSIS — M75121 Complete rotator cuff tear or rupture of right shoulder, not specified as traumatic: Secondary | ICD-10-CM | POA: Diagnosis not present

## 2018-01-13 DIAGNOSIS — J449 Chronic obstructive pulmonary disease, unspecified: Secondary | ICD-10-CM | POA: Insufficient documentation

## 2018-01-13 DIAGNOSIS — M1990 Unspecified osteoarthritis, unspecified site: Secondary | ICD-10-CM | POA: Diagnosis not present

## 2018-01-13 DIAGNOSIS — Z980 Intestinal bypass and anastomosis status: Secondary | ICD-10-CM | POA: Insufficient documentation

## 2018-01-13 DIAGNOSIS — M19011 Primary osteoarthritis, right shoulder: Secondary | ICD-10-CM | POA: Diagnosis present

## 2018-01-13 DIAGNOSIS — Z7951 Long term (current) use of inhaled steroids: Secondary | ICD-10-CM | POA: Insufficient documentation

## 2018-01-13 DIAGNOSIS — J45909 Unspecified asthma, uncomplicated: Secondary | ICD-10-CM | POA: Diagnosis not present

## 2018-01-13 DIAGNOSIS — Z9842 Cataract extraction status, left eye: Secondary | ICD-10-CM | POA: Insufficient documentation

## 2018-01-13 DIAGNOSIS — Z87891 Personal history of nicotine dependence: Secondary | ICD-10-CM | POA: Insufficient documentation

## 2018-01-13 DIAGNOSIS — E785 Hyperlipidemia, unspecified: Secondary | ICD-10-CM | POA: Insufficient documentation

## 2018-01-13 DIAGNOSIS — I10 Essential (primary) hypertension: Secondary | ICD-10-CM | POA: Diagnosis not present

## 2018-01-13 DIAGNOSIS — M7541 Impingement syndrome of right shoulder: Secondary | ICD-10-CM | POA: Insufficient documentation

## 2018-01-13 DIAGNOSIS — Z79899 Other long term (current) drug therapy: Secondary | ICD-10-CM | POA: Insufficient documentation

## 2018-01-13 HISTORY — PX: SHOULDER ARTHROSCOPY WITH OPEN ROTATOR CUFF REPAIR: SHX6092

## 2018-01-13 SURGERY — ARTHROSCOPY, SHOULDER WITH REPAIR, ROTATOR CUFF, OPEN
Anesthesia: Regional | Laterality: Right

## 2018-01-13 MED ORDER — FAMOTIDINE 20 MG PO TABS
ORAL_TABLET | ORAL | Status: AC
  Administered 2018-01-13: 20 mg via ORAL
  Filled 2018-01-13: qty 1

## 2018-01-13 MED ORDER — FUROSEMIDE 10 MG/ML IJ SOLN
10.0000 mg | Freq: Once | INTRAMUSCULAR | Status: AC
  Administered 2018-01-13: 10 mg via INTRAVENOUS

## 2018-01-13 MED ORDER — ROCURONIUM BROMIDE 100 MG/10ML IV SOLN
INTRAVENOUS | Status: DC | PRN
  Administered 2018-01-13: 50 mg via INTRAVENOUS

## 2018-01-13 MED ORDER — BUPIVACAINE HCL (PF) 0.5 % IJ SOLN
INTRAMUSCULAR | Status: AC
  Filled 2018-01-13: qty 10

## 2018-01-13 MED ORDER — SUGAMMADEX SODIUM 200 MG/2ML IV SOLN
INTRAVENOUS | Status: AC
  Filled 2018-01-13: qty 2

## 2018-01-13 MED ORDER — FAMOTIDINE 20 MG PO TABS
20.0000 mg | ORAL_TABLET | Freq: Once | ORAL | Status: AC
  Administered 2018-01-13: 20 mg via ORAL

## 2018-01-13 MED ORDER — VASOPRESSIN 20 UNIT/ML IV SOLN
INTRAVENOUS | Status: DC | PRN
  Administered 2018-01-13: 2 [IU] via INTRAVENOUS

## 2018-01-13 MED ORDER — METOCLOPRAMIDE HCL 5 MG/ML IJ SOLN: 5.0000 mg | Freq: Three times a day (TID) | INTRAMUSCULAR | Status: DC | PRN

## 2018-01-13 MED ORDER — LIDOCAINE HCL (CARDIAC) PF 100 MG/5ML IV SOSY
PREFILLED_SYRINGE | INTRAVENOUS | Status: DC | PRN
  Administered 2018-01-13: 80 mg via INTRAVENOUS

## 2018-01-13 MED ORDER — OXYCODONE HCL 5 MG PO TABS: 5.0000 mg | ORAL_TABLET | ORAL | Status: DC | PRN

## 2018-01-13 MED ORDER — LIDOCAINE HCL 4 % MT SOLN
OROMUCOSAL | Status: DC | PRN
  Administered 2018-01-13: 4 mL via TOPICAL

## 2018-01-13 MED ORDER — PHENYLEPHRINE HCL 10 MG/ML IJ SOLN
INTRAMUSCULAR | Status: DC | PRN
  Administered 2018-01-13 (×2): 100 ug via INTRAVENOUS

## 2018-01-13 MED ORDER — LACTATED RINGERS IV SOLN
INTRAVENOUS | Status: DC | PRN
  Administered 2018-01-13: 3000 mL

## 2018-01-13 MED ORDER — ONDANSETRON HCL 4 MG/2ML IJ SOLN: 4.0000 mg | Freq: Four times a day (QID) | INTRAMUSCULAR | Status: DC | PRN

## 2018-01-13 MED ORDER — PROPOFOL 10 MG/ML IV BOLUS
INTRAVENOUS | Status: AC
  Filled 2018-01-13: qty 20

## 2018-01-13 MED ORDER — ROCURONIUM BROMIDE 50 MG/5ML IV SOLN
INTRAVENOUS | Status: AC
  Filled 2018-01-13: qty 1

## 2018-01-13 MED ORDER — FUROSEMIDE 10 MG/ML IJ SOLN
INTRAMUSCULAR | Status: AC
  Filled 2018-01-13: qty 2

## 2018-01-13 MED ORDER — LIDOCAINE HCL (PF) 2 % IJ SOLN
INTRAMUSCULAR | Status: AC
  Filled 2018-01-13: qty 10

## 2018-01-13 MED ORDER — OXYCODONE HCL 5 MG PO TABS: 5.0000 mg | ORAL_TABLET | ORAL | 0 refills | Status: DC | PRN

## 2018-01-13 MED ORDER — BUPIVACAINE LIPOSOME 1.3 % IJ SUSP
INTRAMUSCULAR | Status: AC
  Filled 2018-01-13: qty 20

## 2018-01-13 MED ORDER — FENTANYL CITRATE (PF) 100 MCG/2ML IJ SOLN
INTRAMUSCULAR | Status: AC
  Administered 2018-01-13: 50 ug
  Filled 2018-01-13: qty 2

## 2018-01-13 MED ORDER — LACTATED RINGERS IV SOLN
INTRAVENOUS | Status: DC
  Administered 2018-01-13: 08:00:00 via INTRAVENOUS

## 2018-01-13 MED ORDER — BUPIVACAINE-EPINEPHRINE 0.5% -1:200000 IJ SOLN
INTRAMUSCULAR | Status: DC | PRN
  Administered 2018-01-13: 30 mL

## 2018-01-13 MED ORDER — FENTANYL CITRATE (PF) 100 MCG/2ML IJ SOLN
INTRAMUSCULAR | Status: DC | PRN
  Administered 2018-01-13: 50 ug via INTRAVENOUS

## 2018-01-13 MED ORDER — FENTANYL CITRATE (PF) 100 MCG/2ML IJ SOLN: 25.0000 ug | Freq: Once | INTRAMUSCULAR | Status: DC

## 2018-01-13 MED ORDER — SODIUM CHLORIDE 0.9 % IV SOLN
INTRAVENOUS | Status: DC | PRN
  Administered 2018-01-13: 40 ug/min via INTRAVENOUS

## 2018-01-13 MED ORDER — BUPIVACAINE HCL (PF) 0.5 % IJ SOLN
INTRAMUSCULAR | Status: DC | PRN
  Administered 2018-01-13 (×2): 5 mL

## 2018-01-13 MED ORDER — IPRATROPIUM-ALBUTEROL 0.5-2.5 (3) MG/3ML IN SOLN
RESPIRATORY_TRACT | Status: AC
  Administered 2018-01-13: 3 mL via RESPIRATORY_TRACT
  Filled 2018-01-13: qty 3

## 2018-01-13 MED ORDER — BUPIVACAINE LIPOSOME 1.3 % IJ SUSP
INTRAMUSCULAR | Status: DC | PRN
  Administered 2018-01-13 (×4): 5 mL

## 2018-01-13 MED ORDER — PROPOFOL 10 MG/ML IV BOLUS
INTRAVENOUS | Status: DC | PRN
  Administered 2018-01-13: 150 mg via INTRAVENOUS

## 2018-01-13 MED ORDER — SUGAMMADEX SODIUM 200 MG/2ML IV SOLN
INTRAVENOUS | Status: DC | PRN
  Administered 2018-01-13: 200 mg via INTRAVENOUS
  Administered 2018-01-13: 100 mg via INTRAVENOUS

## 2018-01-13 MED ORDER — IPRATROPIUM-ALBUTEROL 0.5-2.5 (3) MG/3ML IN SOLN
3.0000 mL | Freq: Once | RESPIRATORY_TRACT | Status: AC
  Administered 2018-01-13: 3 mL via RESPIRATORY_TRACT

## 2018-01-13 MED ORDER — DEXMEDETOMIDINE HCL IN NACL 400 MCG/100ML IV SOLN
INTRAVENOUS | Status: DC | PRN
  Administered 2018-01-13: 8 ug/kg/h via INTRAVENOUS

## 2018-01-13 MED ORDER — POTASSIUM CHLORIDE IN NACL 20-0.9 MEQ/L-% IV SOLN: INTRAVENOUS | Status: DC

## 2018-01-13 MED ORDER — ONDANSETRON HCL 4 MG/2ML IJ SOLN
INTRAMUSCULAR | Status: AC
  Filled 2018-01-13: qty 2

## 2018-01-13 MED ORDER — ONDANSETRON HCL 4 MG PO TABS: 4.0000 mg | ORAL_TABLET | Freq: Four times a day (QID) | ORAL | Status: DC | PRN

## 2018-01-13 MED ORDER — CEFAZOLIN SODIUM-DEXTROSE 2-4 GM/100ML-% IV SOLN
INTRAVENOUS | Status: AC
  Filled 2018-01-13: qty 100

## 2018-01-13 MED ORDER — METOCLOPRAMIDE HCL 10 MG PO TABS: 5.0000 mg | ORAL_TABLET | Freq: Three times a day (TID) | ORAL | Status: DC | PRN

## 2018-01-13 MED ORDER — FENTANYL CITRATE (PF) 100 MCG/2ML IJ SOLN
INTRAMUSCULAR | Status: AC
  Filled 2018-01-13: qty 2

## 2018-01-13 MED ORDER — DEXAMETHASONE SODIUM PHOSPHATE 10 MG/ML IJ SOLN
INTRAMUSCULAR | Status: AC
  Filled 2018-01-13: qty 1

## 2018-01-13 MED ORDER — DEXMEDETOMIDINE HCL IN NACL 200 MCG/50ML IV SOLN
INTRAVENOUS | Status: AC
  Filled 2018-01-13: qty 50

## 2018-01-13 MED ORDER — EPHEDRINE SULFATE 50 MG/ML IJ SOLN
INTRAMUSCULAR | Status: DC | PRN
  Administered 2018-01-13: 15 mg via INTRAVENOUS

## 2018-01-13 MED ORDER — LIDOCAINE HCL 1 % IJ SOLN
INTRAMUSCULAR | Status: AC
  Filled 2018-01-13: qty 2

## 2018-01-13 MED ORDER — DEXAMETHASONE SODIUM PHOSPHATE 10 MG/ML IJ SOLN
INTRAMUSCULAR | Status: DC | PRN
  Administered 2018-01-13: 10 mg via INTRAVENOUS

## 2018-01-13 MED ORDER — ONDANSETRON HCL 4 MG/2ML IJ SOLN
INTRAMUSCULAR | Status: DC | PRN
  Administered 2018-01-13: 4 mg via INTRAVENOUS

## 2018-01-13 SURGICAL SUPPLY — 46 items
ANCHOR BONE REGENETEN (Anchor) ×1 IMPLANT
ANCHOR JUGGERKNOT WTAP NDL 2.9 (Anchor) ×3 IMPLANT
ANCHOR TENDON REGENETEN (Staple) ×1 IMPLANT
BIT DRILL JUGRKNT W/NDL BIT2.9 (DRILL) IMPLANT
BLADE FULL RADIUS 3.5 (BLADE) ×2 IMPLANT
BUR ACROMIONIZER 4.0 (BURR) ×2 IMPLANT
CANNULA SHAVER 8MMX76MM (CANNULA) ×2 IMPLANT
CHLORAPREP W/TINT 26ML (MISCELLANEOUS) ×2 IMPLANT
COVER MAYO STAND STRL (DRAPES) ×2 IMPLANT
COVER WAND RF STERILE (DRAPES) ×2 IMPLANT
DRAPE IMP U-DRAPE 54X76 (DRAPES) ×4 IMPLANT
DRILL JUGGERKNOT W/NDL BIT 2.9 (DRILL) ×2
ELECT REM PT RETURN 9FT ADLT (ELECTROSURGICAL) ×2
ELECTRODE REM PT RTRN 9FT ADLT (ELECTROSURGICAL) ×1 IMPLANT
GAUZE PETRO XEROFOAM 1X8 (MISCELLANEOUS) ×2 IMPLANT
GAUZE SPONGE 4X4 12PLY STRL (GAUZE/BANDAGES/DRESSINGS) ×2 IMPLANT
GLOVE BIO SURGEON STRL SZ7.5 (GLOVE) ×4 IMPLANT
GLOVE BIO SURGEON STRL SZ8 (GLOVE) ×4 IMPLANT
GLOVE BIOGEL PI IND STRL 8 (GLOVE) ×1 IMPLANT
GLOVE BIOGEL PI INDICATOR 8 (GLOVE) ×1
GLOVE INDICATOR 8.0 STRL GRN (GLOVE) ×2 IMPLANT
GOWN STRL REUS W/ TWL LRG LVL3 (GOWN DISPOSABLE) ×1 IMPLANT
GOWN STRL REUS W/ TWL XL LVL3 (GOWN DISPOSABLE) ×1 IMPLANT
GOWN STRL REUS W/TWL LRG LVL3 (GOWN DISPOSABLE) ×1
GOWN STRL REUS W/TWL XL LVL3 (GOWN DISPOSABLE) ×1
GRASPER SUT 15 45D LOW PRO (SUTURE) IMPLANT
IMPL REGENETEN MEDIUM (Shoulder) IMPLANT
IMPLANT REGENETEN MEDIUM (Shoulder) ×2 IMPLANT
IV LACTATED RINGER IRRG 3000ML (IV SOLUTION) ×2
IV LR IRRIG 3000ML ARTHROMATIC (IV SOLUTION) ×2 IMPLANT
MANIFOLD NEPTUNE II (INSTRUMENTS) ×2 IMPLANT
MASK FACE SPIDER DISP (MASK) ×2 IMPLANT
MAT ABSORB  FLUID 56X50 GRAY (MISCELLANEOUS) ×1
MAT ABSORB FLUID 56X50 GRAY (MISCELLANEOUS) ×1 IMPLANT
PACK ARTHROSCOPY SHOULDER (MISCELLANEOUS) ×2 IMPLANT
SLING ARM LRG DEEP (SOFTGOODS) ×2 IMPLANT
SLING ULTRA II LG (MISCELLANEOUS) ×2 IMPLANT
STAPLER SKIN PROX 35W (STAPLE) ×2 IMPLANT
STRAP SAFETY 5IN WIDE (MISCELLANEOUS) ×2 IMPLANT
SUT ETHIBOND 0 MO6 C/R (SUTURE) ×2 IMPLANT
SUT VIC AB 2-0 CT1 27 (SUTURE) ×2
SUT VIC AB 2-0 CT1 TAPERPNT 27 (SUTURE) ×2 IMPLANT
TAPE MICROFOAM 4IN (TAPE) ×2 IMPLANT
TUBING ARTHRO INFLOW-ONLY STRL (TUBING) ×2 IMPLANT
TUBING CONNECTING 10 (TUBING) ×2 IMPLANT
WAND HAND CNTRL MULTIVAC 90 (MISCELLANEOUS) ×2 IMPLANT

## 2018-01-13 NOTE — Anesthesia Post-op Follow-up Note (Signed)
Anesthesia QCDR form completed.        

## 2018-01-13 NOTE — Anesthesia Procedure Notes (Signed)
Anesthesia Regional Block: Interscalene brachial plexus block   Pre-Anesthetic Checklist: ,, timeout performed, Correct Patient, Correct Site, Correct Laterality, Correct Procedure, Correct Position, site marked, Risks and benefits discussed,  Surgical consent,  Pre-op evaluation,  At surgeon's request and post-op pain management  Laterality: Right  Prep: chloraprep       Needles:  Injection technique: Single-shot  Needle Type: Stimiplex     Needle Length: 9cm  Needle Gauge: 21     Additional Needles:   Narrative:   Performed by: Personally  Anesthesiologist: Durenda Hurt, MD

## 2018-01-13 NOTE — Op Note (Signed)
01/13/2018  11:46 AM  Patient:   Scott Branch  Pre-Op Diagnosis:   Impingement/tendinopathy with full-thickness rotator cuff tear, right shoulder.  Post-Op Diagnosis:   Impingement/tendinopathy with complex full-thickness rotator cuff tear, degenerative labral fraying, early degenerative joint disease, and biceps tendinopathy, right shoulder.  Procedure:   Limited arthroscopic debridement, arthroscopic subacromial decompression, mini-open rotator cuff repair incorporating a [employer] Regeneten patch, and mini-open biceps tenodesis, right shoulder.  Anesthesia:   General endotracheal with interscalene block utilizing Exparel placed preoperatively by the anesthesiologist.  Surgeon:   Pascal Lux, MD  Assistant:   Cameron Proud, PA-C  Findings:   As above. The anterior superior portions of the labrum demonstrated degenerative fraying without detachment from the glenoid rim. There was a complex delaminating type of degenerative tear involving the mid and posterior portions of the supraspinatus tendon, extending into the infraspinatus tendon. There also was minor articular surface tearing of the superior most portion of the subscapularis tendon. The remainder of the rotator cuff was in satisfactory condition. There were significant tendinopathic changes of the intra-articular portion of the biceps tendon with flattening. There were focal grade 3 chondromalacial changes involving the central portion of the glenoid and diffuse grade 2-3 chondromalacial changes involving the humeral head.  Complications:   None  Fluids:   750 cc  Estimated blood loss:   5 cc  Tourniquet time:   None  Drains:   None  Closure:   Staples      Brief clinical note:   The patient is an 82 year old male with a history of progressively worsening right shoulder pain. The patient's symptoms have progressed despite medications, activity modification, etc. The patient's history and examination are  consistent with impingement/tendinopathy with a rotator cuff tear. These findings were confirmed by MRI scan. The patient presents at this time for definitive management of these shoulder symptoms.  Procedure:   The patient underwent placement of an interscalene block using Exparel by the anesthesiologist in the preoperative holding area before being brought into the operating room and lain in the supine position. The patient then underwent general endotracheal intubation and anesthesia before being repositioned in the beach chair position using the beach chair positioner. The right shoulder and upper extremity were prepped with ChloraPrep solution before being draped sterilely. Preoperative antibiotics were administered. A timeout was performed to confirm the proper surgical site before the expected portal sites and incision site were injected with 0.5% Sensorcaine with epinephrine. A posterior portal was created and the glenohumeral joint thoroughly inspected with the findings as described above. An anterior portal was created using an outside-in technique. The labrum and rotator cuff were further probed, again confirming the above-noted findings. The areas of labral fraying and synovitis were debrided back to stable margins using the full-radius resector. The ArthroCare wand was inserted and used to release the biceps tendon from its labral anchor. It also was used to obtain hemostasis as well as to "anneal" the labrum superiorly and anteriorly. The instruments were removed from the joint after suctioning the excess fluid.  The camera was repositioned through the posterior portal into the subacromial space. A separate lateral portal was created using an outside-in technique. The 3.5 mm full-radius resector was introduced and used to perform a subtotal bursectomy. The ArthroCare wand was then inserted and used to remove the periosteal tissue off the undersurface of the anterior third of the acromion as well as  to recess the coracoacromial ligament from its attachment along the anterior and  lateral margins of the acromion. The 4.0 mm acromionizing bur was introduced and used to complete the decompression by removing the undersurface of the anterior third of the acromion. The full radius resector was reintroduced to remove any residual bony debris before the ArthroCare wand was reintroduced to obtain hemostasis. The instruments were then removed from the subacromial space after suctioning the excess fluid.  An approximately 4-5 cm incision was made over the anterolateral aspect of the shoulder beginning at the anterolateral corner of the acromion and extending distally in line with the bicipital groove. This incision was carried down through the subcutaneous tissues to expose the deltoid fascia. The raphae between the anterior and middle thirds was identified and this plane developed to provide access into the subacromial space. Additional bursal tissues were debrided sharply using Metzenbaum scissors. The rotator cuff tear was readily identified. The margins were debrided sharply with a #15 blade and the exposed greater tuberosity roughened with a rongeur. The tear was repaired using two Biomet 2.9 mm JuggerKnot anchors. These sutures were then brought back laterally and used to pull the delaminated flap of rotator cuff tissue which was still attached distally. After this repair, two additional #0 Ethibond interrupted sutures were used to secure the edge of the flap to the main portion of the rotator cuff. This entire construct was then covered with a medium Polvadera patch which was secured using the appropriate tendon and bone staples. An apparent watertight closure was obtained.  The bicipital groove was identified by palpation and opened for 1-1.5 cm. The biceps tendon stump was retrieved through this defect. The floor of the bicipital groove was roughened with a curet before another Biomet 2.9 mm  JuggerKnot anchor was inserted. Both sets of sutures were passed through the biceps tendon and tied securely to effect the tenodesis. The bicipital sheath was reapproximated using two #0 Ethibond interrupted sutures, incorporating the biceps tendon to further reinforce the tenodesis.  The wound was copiously irrigated with sterile saline solution before the deltoid raphae was reapproximated using 2-0 Vicryl interrupted sutures. The subcutaneous tissues were closed in two layers using 2-0 Vicryl interrupted sutures before the skin was closed using staples. The portal sites also were closed using staples. A sterile bulky dressing was applied to the shoulder before the arm was placed into a shoulder immobilizer. The patient was then awakened, extubated, and returned to the recovery room in satisfactory condition after tolerating the procedure well.

## 2018-01-13 NOTE — Transfer of Care (Signed)
Immediate Anesthesia Transfer of Care Note  Patient: Scott Branch  Procedure(s) Performed: SHOULDER ARTHROSCOPY WITH OPEN ROTATOR CUFF REPAIR (Right )  Patient Location: PACU  Anesthesia Type:General  Level of Consciousness: awake, alert , oriented and patient cooperative  Airway & Oxygen Therapy: Patient Spontanous Breathing and Patient connected to face mask oxygen  Post-op Assessment: Report given to RN and Post -op Vital signs reviewed and stable  Post vital signs: Reviewed and stable  Last Vitals:  Vitals Value Taken Time  BP 158/79 01/13/2018 12:04 PM  Temp    Pulse 88 01/13/2018 12:08 PM  Resp 15 01/13/2018 12:08 PM  SpO2 96 % 01/13/2018 12:08 PM  Vitals shown include unvalidated device data.  Last Pain:  Vitals:   01/13/18 0805  TempSrc: Tympanic  PainSc: 0-No pain         Complications: No apparent anesthesia complications

## 2018-01-13 NOTE — Anesthesia Procedure Notes (Signed)
Procedure Name: Intubation Date/Time: 01/13/2018 10:10 AM Performed by: Eben Burow, CRNA Pre-anesthesia Checklist: Patient identified, Emergency Drugs available, Suction available and Patient being monitored Patient Re-evaluated:Patient Re-evaluated prior to induction Oxygen Delivery Method: Circle system utilized Preoxygenation: Pre-oxygenation with 100% oxygen Induction Type: IV induction Ventilation: Mask ventilation without difficulty Laryngoscope Size: Miller and 2 Grade View: Grade I Tube type: Oral Tube size: 7.5 mm Number of attempts: 1 Airway Equipment and Method: Stylet and LTA kit utilized Placement Confirmation: ETT inserted through vocal cords under direct vision,  positive ETCO2 and breath sounds checked- equal and bilateral Secured at: 23 cm Tube secured with: Tape Dental Injury: Teeth and Oropharynx as per pre-operative assessment

## 2018-01-13 NOTE — Discharge Instructions (Addendum)
Orthopedic discharge instructions: Keep dressing dry and intact.  May shower after dressing changed on post-op day #4 (Monday).  Cover staples with Band-Aids after drying off. Apply ice frequently to shoulder. Take ibuprofen 600 mg TID with meals for 7-10 days, then as necessary. Take oxycodone as prescribed when needed.  May supplement with ES Tylenol if necessary. Keep shoulder immobilizer on at all times except may remove for bathing purposes. Follow-up in 10-14 days or as scheduled.   Shoulder Arthroscopy, Care After This sheet gives you information about how to care for yourself after your procedure. Your health care provider may also give you more specific instructions. If you have problems or questions, contact your health care provider. What can I expect after the procedure? After the procedure, it is common to have:  Pain that can be relieved by taking pain medicine.  Swelling.  A small amount of fluid from the incision.  Stiffness that improves over time. Follow these instructions at home: If you have a sling or immobilizer:  Wear the sling or immobilizer as told by your health care provider. Remove it only as told by your health care provider. These devices protect your shoulder and help it heal by keeping it in place.  Loosen the sling or immobilizer if your fingers tingle, become numb, or turn cold and blue.  Keep the sling or immobilizer clean.  Ask if you may remove the sling or immobilizer for bathing. If you need to keep it on while bathing and it is not waterproof: ? Do not let it get wet. ? Cover it with a watertight covering when you take a bath or a shower. Incision care   Follow instructions from your health care provider about how to take care of your incisions. Make sure you: ? Wash your hands with soap and water before you change your bandage (dressing). If soap and water are not available, use hand sanitizer. ? Change your dressing as told by your  health care provider. ? Leave stitches (sutures), staples, skin glue, or adhesive strips in place. These skin closures may need to stay in place for 2 weeks or longer. If adhesive strip edges start to loosen and curl up, you may trim the loose edges. Do not remove adhesive strips completely unless your health care provider tells you to do that.  Check your incision areas every day for signs of infection. Check for: ? Redness ? More swelling or pain. ? Blood or more fluid. ? Warmth. ? Pus or a bad smell. Bathing  Do not take baths, swim, or use a hot tub until your health care provider approves. Ask your health care provider if you may take showers. You may only be allowed to take sponge baths. Activity  Ask your health care provider what activities are safe for you during recovery, and ask what activities you need to avoid.  Do not lift with your affected shoulder until your health care provider approves.  Avoid pulling and pushing with the arm on your affected side.  If physical therapy was prescribed, do exercises as directed. Doing exercises may help to improve shoulder movement and flexibility (range of motion). Driving  Do not drive until your health care provider approves.  Do not drive or use heavy machinery while taking prescription pain medicine. Managing pain, stiffness, and swelling   If lying down flat causes shoulder discomfort, it may help to sleep in a sitting position for a few days after your procedure. Try sleeping in a  reclining chair or propping yourself up with extra pillows in bed.  If directed, put ice on the affected area: ? Put ice in a plastic bag or use the icing device (cold therapy unit) that you were given. Follow instructions from your health care provider about how to use the icing device. ? Place a towel between your skin and the bag or between your skin and the icing device. ? Leave the ice on for 20 minutes, 2-3 times a day.  Move your fingers  often to avoid stiffness and to lessen swelling. General instructions  Take over-the-counter and prescription medicines only as told by your health care provider.  If you are taking prescription pain medicine, take actions to prevent or treat constipation. Your health care provider may recommend that you: ? Drink enough fluid to keep your urine pale yellow. ? Eat foods that are high in fiber, such as fresh fruits and vegetables, whole grains, and beans. ? Limit foods that are high in fat and processed sugars, such as fried or sweet foods. ? Take an over-the-counter or prescription medicine for constipation.  Do not use any products that contain nicotine or tobacco, such as cigarettes and e-cigarettes. These can delay incision or bone healing. If you need help quitting, ask your health care provider.  Keep all follow-up visits as told by your health care provider. This is important. Contact a health care provider if you:  Have a fever.  Have severe pain.  Have redness around an incision.  Have more swelling or pain in an incision area.  Have blood or more fluid coming from an incision.  Notice that an incision feels warm to the touch.  Notice pus or a bad smell coming from an incision.  Notice that an incision has opened up.  Develop a rash. Get help right away if you:  Have difficulty breathing.  Have chest pain.  Notice that your fingers tingle, are numb, or are cold and blue even after you loosen your sling or immobilizer.  Develop pain in your lower leg or at the back of your knee. Summary  If you have a sling or immobilizer, wear it as told by your health care provider. These devices protect your shoulder and help it heal by keeping it in place.  If lying down flat causes shoulder discomfort, it may help to sleep in a sitting position for a few days after your procedure. Try sleeping in a reclining chair, or try propping yourself up with extra pillows in bed.  If  physical therapy was prescribed, do exercises as directed. Doing exercises may help to improve shoulder movement and flexibility (range of motion).  Keep all follow-up visits as told by your health care provider. This is important. This information is not intended to replace advice given to you by your health care provider. Make sure you discuss any questions you have with your health care provider. Document Released: 08/09/2013 Document Revised: 11/27/2016 Document Reviewed: 11/27/2016 Elsevier Interactive Patient Education  2019 [place]   1) The drugs that you were given will stay in your system until tomorrow so for the next 24 hours you should not:  A) Drive an automobile B) Make any legal decisions C) Drink any alcoholic beverage   2) You may resume regular meals tomorrow.  Today it is better to start with liquids and gradually work up to solid foods.  You may eat anything you prefer, but it is better to  start with liquids, then soup and crackers, and gradually work up to solid foods.   3) Please notify your doctor immediately if you have any unusual bleeding, trouble breathing, redness and pain at the surgery site, drainage, fever, or pain not relieved by medication.    4) Additional Instructions:        Please contact your physician with any problems or Same Day Surgery at [phone], Monday through Friday 6 am to 4 pm, or [place] at [facility] number at [phone].

## 2018-01-13 NOTE — H&P (Signed)
Paper H&P to be scanned into permanent record. H&P reviewed and patient re-examined. No changes. 

## 2018-01-13 NOTE — Anesthesia Preprocedure Evaluation (Signed)
Anesthesia Evaluation  Patient identified by MRN, date of birth, ID band Patient awake    Reviewed: Allergy & Precautions, H&P , NPO status , Patient's Chart, lab work & pertinent test results  Airway Mallampati: III   Neck ROM: full    Dental  (+) Chipped   Pulmonary COPD,  COPD inhaler, former smoker,  Pt uses ipratropium/LABA combo BID and albuterol daily for prevention purposes; has not required albuterol for any exacerbations recently. No recent illnesses. SpO2 97% RA.           Cardiovascular hypertension, negative cardio ROS       Neuro/Psych negative neurological ROS  negative psych ROS   GI/Hepatic negative GI ROS, Neg liver ROS,   Endo/Other  negative endocrine ROS  Renal/GU CRFRenal disease     Musculoskeletal   Abdominal   Peds  Hematology negative hematology ROS (+)   Anesthesia Other Findings Past Medical History: No date: Arthritis No date: Cancer ([place])     Comment:  PROSTATE /COLON No date: Chronic kidney disease     Comment:  STAGE 3 No date: COPD (chronic obstructive pulmonary disease) (HCC)     Comment:  CHRONIC BRONCHITIS No date: Cough     Comment:  CHRONIC BRONCHITIS No date: Hypertension  Past Surgical History: No date: COLON SURGERY No date: EYE SURGERY     Comment:  Bilateral cataract surgery. No date: HERNIA REPAIR     Comment:  X 2 08/27/2017: KNEE ARTHROSCOPY; Left     Comment:  Procedure: ARTHROSCOPY KNEE  PARTIAL MEDIAL MENISECTOMY               CHONDROPLASTY;  Surgeon: Dereck Leep, MD;  Location:               [facility];  Service: Orthopedics;  Laterality: Left; No date: PROSTATECTOMY  BMI    Body Mass Index:  29.99 kg/m      Reproductive/Obstetrics negative OB ROS                             Anesthesia Physical Anesthesia Plan  ASA: III  Anesthesia Plan: General ETT and Regional   Post-op Pain Management:    Induction:   PONV  Risk Score and Plan: Ondansetron, Dexamethasone and Treatment may vary due to age or medical condition  Airway Management Planned:   Additional Equipment:   Intra-op Plan:   Post-operative Plan:   Informed Consent: I have reviewed the patients History and Physical, chart, labs and discussed the procedure including the risks, benefits and alternatives for the proposed anesthesia with the patient or authorized representative who has indicated his/her understanding and acceptance.   Dental Advisory Given  Plan Discussed with: Anesthesiologist, CRNA and Surgeon  Anesthesia Plan Comments:         Anesthesia Quick Evaluation

## 2018-01-14 NOTE — Anesthesia Postprocedure Evaluation (Addendum)
Anesthesia Post Note  Patient: Scott Branch  Procedure(s) Performed: SHOULDER ARTHROSCOPY WITH OPEN ROTATOR CUFF REPAIR (Right )  Patient location during evaluation: PACU Anesthesia Type: Regional Level of consciousness: awake and alert Pain management: pain level controlled Vital Signs Assessment: post-procedure vital signs reviewed and stable Respiratory status: spontaneous breathing, nonlabored ventilation, respiratory function stable and patient connected to nasal cannula oxygen Cardiovascular status: blood pressure returned to baseline and stable Postop Assessment: no apparent nausea or vomiting Anesthetic complications: no     Last Vitals:  Vitals:   01/13/18 1402 01/13/18 1431  BP: 133/69   Pulse:  88  Resp: 16 16  Temp: 36.5 C   SpO2: 94% 96%    Last Pain:  Vitals:   01/14/18 0853  TempSrc:   PainSc: 0-No pain                 Durenda Hurt

## 2018-01-26 HISTORY — PX: BLEPHAROPLASTY: SUR158

## 2018-06-14 DIAGNOSIS — H02834 Dermatochalasis of left upper eyelid: Secondary | ICD-10-CM | POA: Insufficient documentation

## 2018-06-14 DIAGNOSIS — H02409 Unspecified ptosis of unspecified eyelid: Secondary | ICD-10-CM | POA: Insufficient documentation

## 2018-07-20 DIAGNOSIS — J069 Acute upper respiratory infection, unspecified: Secondary | ICD-10-CM | POA: Insufficient documentation

## 2019-05-31 ENCOUNTER — Ambulatory Visit: Payer: Medicare Other | Admitting: Dermatology

## 2019-05-31 ENCOUNTER — Other Ambulatory Visit: Payer: Self-pay

## 2019-05-31 DIAGNOSIS — L578 Other skin changes due to chronic exposure to nonionizing radiation: Secondary | ICD-10-CM

## 2019-05-31 DIAGNOSIS — D692 Other nonthrombocytopenic purpura: Secondary | ICD-10-CM

## 2019-05-31 DIAGNOSIS — L821 Other seborrheic keratosis: Secondary | ICD-10-CM

## 2019-05-31 DIAGNOSIS — L820 Inflamed seborrheic keratosis: Secondary | ICD-10-CM | POA: Diagnosis not present

## 2019-05-31 DIAGNOSIS — L570 Actinic keratosis: Secondary | ICD-10-CM

## 2019-05-31 DIAGNOSIS — S30850A Superficial foreign body of lower back and pelvis, initial encounter: Secondary | ICD-10-CM

## 2019-05-31 DIAGNOSIS — S30860A Insect bite (nonvenomous) of lower back and pelvis, initial encounter: Secondary | ICD-10-CM

## 2019-05-31 NOTE — Progress Notes (Addendum)
   Follow-Up Visit   Subjective  Scott Branch is a 84 y.o. male who presents for the following: Actinic Keratosis (recheck previously treated areas (Ln2) of the face and scalp) and lesion (on the lower back - patient's wife thinks it may be a mole, patient would like it checked).  The following portions of the chart were reviewed this encounter and updated as appropriate:  Tobacco  Allergies  Meds  Problems  Med Hx  Surg Hx  Fam Hx     Review of Systems:  No other skin or systemic complaints except as noted in HPI or Assessment and Plan.  Objective  Well appearing patient in no apparent distress; mood and affect are within normal limits.  A focused examination was performed including the face and scalp. Relevant physical exam findings are noted in the Assessment and Plan.  Objective  face x 9 (9): Erythematous thin papules/macules with gritty scale.   Objective  Face x (7): Erythematous keratotic or waxy stuck-on papule or plaque.   Objective  Mid Back: Embedded tick    Assessment & Plan  AK (actinic keratosis) (9) face x 9  Destruction of lesion - face x 9 Complexity: simple   Destruction method: cryotherapy   Informed consent: discussed and consent obtained   Timeout:  patient name, date of birth, surgical site, and procedure verified Lesion destroyed using liquid nitrogen: Yes   Region frozen until ice ball extended beyond lesion: Yes   Outcome: patient tolerated procedure well with no complications   Post-procedure details: wound care instructions given    Inflamed seborrheic keratosis (7) Face x 7  Destruction of lesion - Face x 7 Complexity: simple   Destruction method: cryotherapy   Timeout:  patient name, date of birth, surgical site, and procedure verified Lesion destroyed using liquid nitrogen: Yes   Region frozen until ice ball extended beyond lesion: Yes   Outcome: patient tolerated procedure well with no complications   Post-procedure  details: wound care instructions given    Tick bite of back, initial encounter Mid Back  Removal with forceps performed today. Advised patient to watch for symptoms of headache, fatigue, muscle/joint aches, nausea, and rash. RTC should symptoms occur.   Actinic Damage - diffuse scaly erythematous macules with underlying dyspigmentation - Recommend daily broad spectrum sunscreen SPF 30+ to sun-exposed areas, reapply every 2 hours as needed.  - Call for new or changing lesions.  Purpura - Violaceous macules and patches - Benign - Related to age, sun damage and/or use of blood thinners - Observe - Can use OTC arnica containing moisturizer such as Dermend Bruise Formula if desired - Call for worsening or other concerns  Seborrheic Keratoses - Stuck-on, waxy, tan-brown papules and plaques  - Discussed benign etiology and prognosis. - Observe - Call for any changes  Return in about 6 months (around 12/01/2019) for UBSE.  Luther Redo, CMA, am acting as scribe for Sarina Ser, MD .  Documentation: I have reviewed the above documentation for accuracy and completeness, and I agree with the above.  Sarina Ser, MD  Documentation: I have reviewed the above documentation for accuracy and completeness, and I agree with the above.  Sarina Ser, MD

## 2019-06-02 ENCOUNTER — Encounter: Payer: Self-pay | Admitting: Dermatology

## 2019-06-13 ENCOUNTER — Other Ambulatory Visit: Payer: Self-pay | Admitting: Physician Assistant

## 2019-06-13 DIAGNOSIS — M2391 Unspecified internal derangement of right knee: Secondary | ICD-10-CM

## 2019-06-28 ENCOUNTER — Other Ambulatory Visit: Payer: Self-pay

## 2019-06-28 ENCOUNTER — Ambulatory Visit
Admission: RE | Admit: 2019-06-28 | Discharge: 2019-06-28 | Disposition: A | Discharge status: Home / Self Care | Payer: Medicare Other | Source: Ambulatory Visit | Attending: Physician Assistant | Admitting: Physician Assistant

## 2019-06-28 DIAGNOSIS — M2391 Unspecified internal derangement of right knee: Secondary | ICD-10-CM | POA: Diagnosis present

## 2019-08-21 NOTE — H&P (Signed)
ORTHOPAEDIC HISTORY & PHYSICAL  Progress Notes Scott Branch, [name], MD - 08/08/2019 3:45 PM EDT Chief Complaint: Chief Complaint  Patient presents with  . Knee Pain  Bilateral knee pain, right > left   Reason for Visit: The patient is a 84 y.o. male who presents today for reevaluation of his right knee. He reports a greater than 1 year history of right knee pain. He does not recall any specific trauma or aggravating event, although the symptoms did increase after doing quite a bit of climbing up and down ladders. He localizes most of the pain along the medial aspect of the knee. He reports some swelling, no locking, and no giving way of the knee. The pain is aggravated by lateral movements, pivoting and squatting. The patient has not appreciated any significant improvement despite Tylenol, intra-articular corticosteroid injections, and activity modification.   Medications: Current Outpatient Medications  Medication Sig Dispense Refill  . acetaminophen (TYLENOL) 650 MG ER tablet Take 650 mg by mouth once daily  . albuterol 90 mcg/actuation inhaler INHALE 2 INHALATIONS BY MOUTH INTO THE LUNGS EVERY 4 HOURS AS NEEDED FOR WHEEZING 51 g 1  . aspirin 81 MG chewable tablet Take 162 mg by mouth once daily.  . cyanocobalamin, vitamin B-12, 2,500 mcg Tab Take by mouth Take 2,500 mcg by mouth daily.  Marland Kitchen doxazosin (CARDURA) 8 MG tablet TAKE 1 TABLET BY MOUTH ONCE DAILY 90 tablet 3  . fluticasone propionate (FLONASE) 50 mcg/actuation nasal spray Place 1 spray into both nostrils 2 (two) times daily as needed for Rhinitis 16 g 11  . lisinopriL-hydrochlorothiazide (ZESTORETIC) 20-12.5 mg tablet Take 2 tablets by mouth once daily 180 tablet 3  . simvastatin (ZOCOR) 40 MG tablet TAKE 1 TABLET BY MOUTH ONCE DAILY 90 tablet 3  . tiotropium-olodaterol (STIOLTO RESPIMAT) 2.5-2.5 mcg/actuation inhaler Inhale 2 inhalations into the lungs once daily  . traMADoL (ULTRAM) 50 mg tablet TAKE 1 TABLET BY MOUTH EVERY  6 HOURS AS NEEDED PAIN 120 tablet 1  . traZODone (DESYREL) 50 MG tablet TAKE 1 TO 2 TABLETS BY MOUTH NIGHTLY AS NEEDED FOR SLEEP 180 tablet 3   No current facility-administered medications for this visit.   Allergies: No Known Allergies  Past Medical History: Past Medical History:  Diagnosis Date  . Asthma without status asthmaticus, unspecified  . Cancer (CMS-HCC)  . Cataract 01/04/2013  . COPD (chronic obstructive pulmonary disease) (CMS-HCC) 01/04/2013  . Hyperlipidemia 01/04/2013  . Hypertension 01/04/2013  . Osteoarthritis 01/04/2013   Past Surgical History: Past Surgical History:  Procedure Laterality Date  . COLECTOMY PARTIAL W/ANASTAMOSIS  cancer  . EXTRACTION CATARACT EXTRACAPSULAR W/INSERTION INTRAOCULAR PROSTHESIS Right 01/16/2013  Procedure: LENSX/LRI+NANO ($600 + N/C)--R--EXTRACTION CATARACT EXTRACAPSULAR W/INSERTION INTRAOCULAR PROSTHESIS; Surgeon: Tomma Rakers, MD; Location: [facility] OR; Service: Ophthalmology; Laterality: Right;  . EXTRACTION CATARACT EXTRACAPSULAR W/INSERTION INTRAOCULAR PROSTHESIS Left 02/27/2013  Procedure: LENSX/LRI +NANO ($600 + N/C)--L--EXTRACTION CATARACT EXTRACAPSULAR W/INSERTION INTRAOCULAR PROSTHESIS; Surgeon: Tomma Rakers, MD; Location: [facility] OR; Service: Ophthalmology; Laterality: Left;  . HERNIA REPAIR Bilateral  . Left knee arthroscopy, partial medial meniscetomy, and chondroplasty 08/27/2017  Dr Marry Guan  . Limited arthroscopic debridement arthroscopic subacromial decompression mini-open rotator cuff repair incorporating a smith and Nephew Reg[employer]en patch and mini0open boceps tenodesis,right shoulder Right 01/13/2018  Dr.Poggi  . PROSTATECTOMY RETROPUBIC  cancer   Social History: Social History   Socioeconomic History  . Marital status: Married  Spouse name: Hassan Rowan  . Number of children: 1  . Years of education: 83  . Highest education  level: Not on file  Occupational History  . Occupation: Retired  Tobacco Use  . Smoking  status: Former Smoker  Packs/day: 2.00  Years: 25.00  Pack years: 50.00  Quit date: 02/05/1980  Years since quitting: 39.5  . Smokeless tobacco: Never Used  Substance and Sexual Activity  . Alcohol use: Yes  Alcohol/week: 7.0 standard drinks  Types: 7 Cans of beer per week  Comment: 1 beer daily  . Drug use: No  . Sexual activity: Defer  Partners: Female  Other Topics Concern  . Not on file  Social History Narrative  . Not on file   Social Determinants of Health   Financial Resource Strain:  . Difficulty of Paying Living Expenses:  Food Insecurity:  . Worried About Charity fundraiser in the Last Year:  . Arboriculturist in the Last Year:  Transportation Needs:  . Film/video editor (Medical):  Marland Kitchen Lack of Transportation (Non-Medical):  Physical Activity:  . Days of Exercise per Week:  . Minutes of Exercise per Session:  Stress:  . Feeling of Stress :  Social Connections:  . Frequency of Communication with Friends and Family:  . Frequency of Social Gatherings with Friends and Family:  . Attends Religious Services:  . Active Member of Clubs or Organizations:  . Attends Archivist Meetings:  Marland Kitchen Marital Status:   Family History: Family History  Problem Relation Age of Onset  . Blindness Neg Hx  . Cataracts Neg Hx  . Diabetes Neg Hx  . Glaucoma Neg Hx  . Macular degeneration Neg Hx   Review of Systems: A comprehensive 14 point ROS was performed, reviewed, and the pertinent orthopaedic findings are documented in the HPI.  Exam BP 158/84  Ht 177.8 cm (5\' 10" )  Wt 98.9 kg (218 lb)  BMI 31.28 kg/m   General:  Well-developed, well-nourished male seen in no acute distress.  Even heel to toe gait.  No varus or valgus thrust to the right knee.  HEENT:  Atraumatic, normocephalic. Pupils are equal and reactive to light. Extraocular motion is intact. Sclera are clear. Oropharynx is clear with moist mucosa.  Lungs:  Clear to auscultation  bilaterally.  Cardiovascular: Regular rate and rhythm. Normal S1, S2. No murmur . No appreciable gallops or rubs. Peripheral pulses are palpable. No lower extremity edema. Homan`s test is negative.   Extremities: Good strength, stability, and range of motion of the upper extremities. Good range of motion of the hips and ankles.  Right Knee:  Soft tissue swelling: minimal Effusion: Trace Erythema: none Crepitance: none Tenderness: medial Alignment: normal Mediolateral laxity: stable Anterior drawer test:negative Lachman`s test: negative McMurray`s test: positive Atrophy: No significant atrophy.  Quadriceps tone was good. Range of Motion: Greater than 120 degrees  Neurologic:  Awake, alert, and oriented.  Sensory function is intact to pinprick and light touch.  Motor strength is judged to be 5/5.  Motor coordination is within normal limits.  No apparent clonus. No tremor.   MRI: I reviewed the right knee MRI from [facility] dated 06/29/2019. I concur with the radiologist's interpretation as below:  MRI OF THE RIGHT KNEE WITHOUT CONTRAST   TECHNIQUE:  Multiplanar, multisequence MR imaging of the knee was performed. No  intravenous contrast was administered.   COMPARISON: None.   FINDINGS:  MENISCI   Medial meniscus: Diminutive and irregular posterior horn medial  meniscus with grade 3 fissuring extending to the superior meniscal  surface in an oblique manner resulting  in superior surface  irregularity, compatible with degenerative tearing. I do not observe  a definite bucket-handle component. Medial meniscal extrusion due to  loss of articular space in the medial compartment. Indistinct  accentuated T2 and proton density weighted signal along the  periphery of the posterior horn medial meniscus with a 0.8 by 0.4 by  1.0 cm cystic lesion along this periphery for example on image 12/11  suggesting a small parameniscal cyst.   Lateral meniscus:  Unremarkable   LIGAMENTS   Cruciates: Unremarkable   Collaterals: Mild thickening and accentuated signal in the proximal  MCL, query low-grade sprain. Fibular collateral ligament intact.   CARTILAGE   Patellofemoral: Full-thickness and near full-thickness loss of  articular cartilagecentrally along the femoral trochlear groove  with mild to moderate chondral thinning along the medial femoral  trochlear groove. Mild chondral thinning elsewhere in the  patellofemoral joint. Mild marginal spurring.   Medial: Prominent degenerative chondral thinning contributing to the  loss of compartmental space and meniscal extrusion. Subcortical  marrow edema along the medial femoral condyle and medial tibial  plateau.   Lateral: Mild degenerative chondral thinning.   Joint: Mild to moderate knee effusion. Superior plica and mildly  thickened medial plica.   Popliteal Fossa: Mild edema signal in the proximal soleus muscle  could reflect a mild soleus strain.   Extensor Mechanism: Prepatellar subcutaneous edema. Mild distal  patellar tendinopathy.   Bones: No significant extra-articular osseous abnormalities  identified.   Other: No supplemental non-categorized findings.   IMPRESSION:  1. Degenerative tearing of the posterior horn medial meniscus, with  grade 3 fissuring extending to thesuperior meniscal surface in an  oblique manner. Small parameniscal cyst along the periphery of the  posterior horn medial meniscus.  2. Prominent degenerative chondral thinning in the medial  compartment with associated subcortical marrow edema and resulting  meniscal extrusion.  3. Mild to moderate knee effusion with superior plica and mildly  thickened medial plica.  4. Mild distal patellar tendinopathy.  5. Mild edema signal in the proximal soleus muscle could reflect a  mild soleus strain.  6. Mild thickening and accentuated signal in the proximal MCL, query  low-grade sprain.    Electronically Signed  By: Van Clines M.D.  On: 06/29/2019 09:54  Impression: Internal derangement of the right knee  Plan:  The findings were discussed in detail with the patient. The patient was given informational material on knee arthroscopy. Conservative treatment options were reviewed with the patient. We discussed the risks and benefits of surgical intervention. Arthroscopy is an appropriate treatment for the meniscal pathology, but would have limited or no effect on degenerative changes of the articular cartilage. The usual perioperative course was also discussed in detail. The patient expressed understanding of the risks and benefits of surgical intervention and would like to proceed with plans for right knee arthroscopy.  MEDICAL CLEARANCE: Per anesthesiology. ACTIVITIES:  Avoid pivoting, squatting, or twisting. WORK STATUS: Not applicable. THERAPY: Quadriceps strengthening exercises. MEDICATIONS: Requested Prescriptions   No prescriptions requested or ordered in this encounter   FOLLOW-UP: Return for preop History & Physical pending surgery date.   Irine Heminger P. Holley Bouche., M.D.  This note was generated in part with voice recognition software and I apologize for any typographical errors that were not detected and corrected.   Electronically signed by Lamar Benes., MD at 08/13/2019 5:48 PM EDT

## 2019-08-23 ENCOUNTER — Other Ambulatory Visit: Payer: Medicare Other

## 2019-08-23 ENCOUNTER — Other Ambulatory Visit: Payer: Self-pay

## 2019-08-23 ENCOUNTER — Encounter
Admission: RE | Admit: 2019-08-23 | Discharge: 2019-08-23 | Disposition: A | Discharge status: Home / Self Care | Payer: Medicare Other | Source: Ambulatory Visit | Attending: Orthopedic Surgery | Admitting: Orthopedic Surgery

## 2019-08-23 NOTE — Patient Instructions (Signed)
COVID TESTING Date: August 24, 2019 Thursday  Testing site:  [facility] [address] Thru Hours:  1:47 am - 1:00 pm Once you are tested, you are asked to stay quarantined (avoiding public places) until after your surgery.   Your procedure is scheduled on: August 25, 2019 THURSDAY Report to Day Surgery on the 2nd floor of the [facility]. To find out your arrival time, please call [phone] between 1PM - 3PM on: Friday August 25, 2019   REMEMBER: Instructions that are not followed completely may result in serious medical risk, up to and including death; or upon the discretion of your surgeon and anesthesiologist your surgery may need to be rescheduled.  Do not eat food after midnight the night before surgery.  No gum chewing, lozengers or hard candies.  You may however, drink CLEAR liquids up to 2 hours before you are scheduled to arrive for your surgery. Do not drink anything within 2 hours of your scheduled arrival time.  Clear liquids include: - water  - apple juice without pulp - gatorade (not RED) - black coffee or tea (Do NOT add milk or creamers to the coffee or tea) Do NOT drink anything that is not on this list.  Type 1 and Type 2 diabetics should only drink water.  ENSURE PRE-SURGERY CARBOHYDRATE DRINK:  Complete drinking 2 hours prior to scheduled arrival time.  TAKE THESE MEDICATIONS THE MORNING OF SURGERY WITH A SIP OF WATER: NONE   Use inhalers on the day of surgery  AND FLONASE  Follow recommendations from Cardiologist, Pulmonologist or PCP regarding stopping Aspirin, Coumadin, Plavix, Eliquis, Pradaxa, or Pletal.  Stop Anti-inflammatories (NSAIDS) such as Advil, Aleve, Ibuprofen, Motrin, Naproxen, Naprosyn and Aspirin based products such as Excedrin, Goodys Powder, BC Powder. (May take Tylenol or Acetaminophen if needed.)  Stop ANY OVER THE COUNTER supplements until after surgery. (May continue Vitamin D, Vitamin B, and  multivitamin.)  No Alcohol for 24 hours before or after surgery.  No Smoking including e-cigarettes for 24 hours prior to surgery.  No chewable tobacco products for at least 6 hours prior to surgery.  No nicotine patches on the day of surgery.  Do not use any "recreational" drugs for at least a week prior to your surgery.  Please be advised that the combination of cocaine and anesthesia may have negative outcomes, up to and including death. If you test positive for cocaine, your surgery will be cancelled.  On the morning of surgery brush your teeth with toothpaste and water, you may rinse your mouth with mouthwash if you wish. Do not swallow any toothpaste or mouthwash.  Do not wear jewelry, make-up, hairpins, clips or nail polish.  Do not wear lotions, powders, or perfumes OR DEODORANT  Do not shave 48 hours prior to surgery.   Contact lenses, hearing aids and dentures may not be worn into surgery.  Do not bring valuables to the hospital. [facility] is not responsible for any missing/lost belongings or valuables.   Use CHG Soap  as directed on instruction sheet.  Notify your doctor if there is any change in your medical condition (cold, fever, infection).  Wear comfortable clothing (specific to your surgery type) to the hospital.  Plan for stool softeners for home use; pain medications have a tendency to cause constipation. You can also help prevent constipation by eating foods high in fiber such as fruits and vegetables and drinking plenty of fluids as your diet allows.  After surgery,  you can help prevent lung complications by doing breathing exercises.  Take deep breaths and cough every 1-2 hours. Your doctor may order a device called an Incentive Spirometer to help you take deep breaths. When coughing or sneezing, hold a pillow firmly against your incision with both hands. This is called "splinting." Doing this helps protect your incision. It also decreases belly  discomfort.  If you are being discharged the day of surgery, you will not be allowed to drive home. You will need a responsible adult (18 years or older) to drive you home and stay with you that night.   If you are taking public transportation, you will need to have a responsible adult (18 years or older) with you. Please confirm with your physician that it is acceptable to use public transportation.   Please call the [facility] at [phone] if you have any questions about these instructions.  Visitation Policy:  Patients undergoing a surgery or procedure may have one family member or support person with them as long as that person is not COVID-19 positive or experiencing its symptoms.  That person may remain in the waiting area during the procedure.  Children under 58 years of age may have both parents or legal guardians with them during their procedure.  Inpatient Visitation Update:   Two designated support people may visit a patient during visiting hours 7 am to 8 pm. It must be the same two designated people for the duration of the patient stay. The visitors may come and go during the day, and there is no switching out to have different visitors. A mask must be worn at all times, including in the patient room.  Children under 17 years of age:  a total of 4 designated visitors for the child's entire stay are allowed. Only 2 in the room at a time and only one staying overnight at a time. The overnight guest can now rotate during the child's hospital stay.  As a reminder, masks are still required for all [place] team members, patients and visitors in all [place] facilities.   Systemwide, no visitors 17 or younger.

## 2019-08-24 ENCOUNTER — Encounter
Admission: RE | Admit: 2019-08-24 | Discharge: 2019-08-24 | Disposition: A | Discharge status: Home / Self Care | Payer: Medicare Other | Source: Ambulatory Visit | Attending: Orthopedic Surgery | Admitting: Orthopedic Surgery

## 2019-08-24 DIAGNOSIS — Z20822 Contact with and (suspected) exposure to covid-19: Secondary | ICD-10-CM | POA: Insufficient documentation

## 2019-08-24 DIAGNOSIS — Z01818 Encounter for other preprocedural examination: Secondary | ICD-10-CM | POA: Diagnosis not present

## 2019-08-24 LAB — SARS CORONAVIRUS 2 (TAT 6-24 HRS): SARS Coronavirus 2: NEGATIVE

## 2019-08-27 ENCOUNTER — Encounter: Payer: Self-pay | Admitting: Orthopedic Surgery

## 2019-08-27 NOTE — H&P (Signed)
ORTHOPAEDIC HISTORY & PHYSICAL Progress Notes - documented in this encounter Table of Contents for Progress Notes  Scott Branch, [name], MD - 08/08/2019 3:45 PM EDT  Kerin Perna, RN - 08/08/2019 3:45 PM EDT    Marry Guan, [name], MD - 08/08/2019 3:45 PM EDT Formatting of this note is different from the original. Images from the original note were not included. Chief Complaint: Chief Complaint  Patient presents with  . Knee Pain  Bilateral knee pain, right > left   Reason for Visit: The patient is a 84 y.o. male who presents today for reevaluation of his right knee. He reports a greater than 1 year history of right knee pain. He does not recall any specific trauma or aggravating event, although the symptoms did increase after doing quite a bit of climbing up and down ladders. He localizes most of the pain along the medial aspect of the knee. He reports some swelling, no locking, and no giving way of the knee. The pain is aggravated by lateral movements, pivoting and squatting. The patient has not appreciated any significant improvement despite Tylenol, intra-articular corticosteroid injections, and activity modification.   Medications: Current Outpatient Medications  Medication Sig Dispense Refill  . acetaminophen (TYLENOL) 650 MG ER tablet Take 650 mg by mouth once daily  . albuterol 90 mcg/actuation inhaler INHALE 2 INHALATIONS BY MOUTH INTO THE LUNGS EVERY 4 HOURS AS NEEDED FOR WHEEZING 51 g 1  . aspirin 81 MG chewable tablet Take 162 mg by mouth once daily.  . cyanocobalamin, vitamin B-12, 2,500 mcg Tab Take by mouth Take 2,500 mcg by mouth daily.  Marland Kitchen doxazosin (CARDURA) 8 MG tablet TAKE 1 TABLET BY MOUTH ONCE DAILY 90 tablet 3  . fluticasone propionate (FLONASE) 50 mcg/actuation nasal spray Place 1 spray into both nostrils 2 (two) times daily as needed for Rhinitis 16 g 11  . lisinopriL-hydrochlorothiazide (ZESTORETIC) 20-12.5 mg tablet Take 2 tablets by mouth once  daily 180 tablet 3  . simvastatin (ZOCOR) 40 MG tablet TAKE 1 TABLET BY MOUTH ONCE DAILY 90 tablet 3  . tiotropium-olodaterol (STIOLTO RESPIMAT) 2.5-2.5 mcg/actuation inhaler Inhale 2 inhalations into the lungs once daily  . traMADoL (ULTRAM) 50 mg tablet TAKE 1 TABLET BY MOUTH EVERY 6 HOURS AS NEEDED PAIN 120 tablet 1  . traZODone (DESYREL) 50 MG tablet TAKE 1 TO 2 TABLETS BY MOUTH NIGHTLY AS NEEDED FOR SLEEP 180 tablet 3   No current facility-administered medications for this visit.   Allergies: No Known Allergies  Past Medical History: Past Medical History:  Diagnosis Date  . Asthma without status asthmaticus, unspecified  . Cancer (CMS-HCC)  . Cataract 01/04/2013  . COPD (chronic obstructive pulmonary disease) (CMS-HCC) 01/04/2013  . Hyperlipidemia 01/04/2013  . Hypertension 01/04/2013  . Osteoarthritis 01/04/2013   Past Surgical History: Past Surgical History:  Procedure Laterality Date  . COLECTOMY PARTIAL W/ANASTAMOSIS  cancer  . EXTRACTION CATARACT EXTRACAPSULAR W/INSERTION INTRAOCULAR PROSTHESIS Right 01/16/2013  Procedure: LENSX/LRI+NANO ($600 + N/C)--R--EXTRACTION CATARACT EXTRACAPSULAR W/INSERTION INTRAOCULAR PROSTHESIS; Surgeon: Tomma Rakers, MD; Location: [facility] OR; Service: Ophthalmology; Laterality: Right;  . EXTRACTION CATARACT EXTRACAPSULAR W/INSERTION INTRAOCULAR PROSTHESIS Left 02/27/2013  Procedure: LENSX/LRI +NANO ($600 + N/C)--L--EXTRACTION CATARACT EXTRACAPSULAR W/INSERTION INTRAOCULAR PROSTHESIS; Surgeon: Tomma Rakers, MD; Location: [facility] OR; Service: Ophthalmology; Laterality: Left;  . HERNIA REPAIR Bilateral  . Left knee arthroscopy, partial medial meniscetomy, and chondroplasty 08/27/2017  Dr Marry Guan  . Limited arthroscopic debridement arthroscopic subacromial decompression mini-open rotator cuff repair incorporating a [employer]  patch and mini0open boceps tenodesis,right shoulder Right 01/13/2018  Dr.Poggi  . PROSTATECTOMY RETROPUBIC   cancer   Social History: Social History   Socioeconomic History  . Marital status: Married  Spouse name: Hassan Rowan  . Number of children: 1  . Years of education: 38  . Highest education level: Not on file  Occupational History  . Occupation: Retired  Tobacco Use  . Smoking status: Former Smoker  Packs/day: 2.00  Years: 25.00  Pack years: 50.00  Quit date: 02/05/1980  Years since quitting: 39.5  . Smokeless tobacco: Never Used  Substance and Sexual Activity  . Alcohol use: Yes  Alcohol/week: 7.0 standard drinks  Types: 7 Cans of beer per week  Comment: 1 beer daily  . Drug use: No  . Sexual activity: Defer  Partners: Female  Other Topics Concern  . Not on file  Social History Narrative  . Not on file   Social Determinants of Health   Financial Resource Strain:  . Difficulty of Paying Living Expenses:  Food Insecurity:  . Worried About Charity fundraiser in the Last Year:  . Arboriculturist in the Last Year:  Transportation Needs:  . Film/video editor (Medical):  Marland Kitchen Lack of Transportation (Non-Medical):  Physical Activity:  . Days of Exercise per Week:  . Minutes of Exercise per Session:  Stress:  . Feeling of Stress :  Social Connections:  . Frequency of Communication with Friends and Family:  . Frequency of Social Gatherings with Friends and Family:  . Attends Religious Services:  . Active Member of Clubs or Organizations:  . Attends Archivist Meetings:  Marland Kitchen Marital Status:   Family History: Family History  Problem Relation Age of Onset  . Blindness Neg Hx  . Cataracts Neg Hx  . Diabetes Neg Hx  . Glaucoma Neg Hx  . Macular degeneration Neg Hx   Review of Systems: A comprehensive 14 point ROS was performed, reviewed, and the pertinent orthopaedic findings are documented in the HPI.  Exam BP 158/84  Ht 177.8 cm (5\' 10" )  Wt 98.9 kg (218 lb)  BMI 31.28 kg/m   General:  Well-developed, well-nourished male seen in no acute  distress.  Even heel to toe gait.  No varus or valgus thrust to the right knee.  HEENT:  Atraumatic, normocephalic. Pupils are equal and reactive to light. Extraocular motion is intact. Sclera are clear. Oropharynx is clear with moist mucosa.  Lungs:  Clear to auscultation bilaterally.  Cardiovascular: Regular rate and rhythm. Normal S1, S2. No murmur . No appreciable gallops or rubs. Peripheral pulses are palpable. No lower extremity edema. Homan`s test is negative.   Extremities: Good strength, stability, and range of motion of the upper extremities. Good range of motion of the hips and ankles.  Right Knee:  Soft tissue swelling: minimal Effusion: Trace Erythema: none Crepitance: none Tenderness: medial Alignment: normal Mediolateral laxity: stable Anterior drawer test:negative Lachman`s test: negative McMurray`s test: positive Atrophy: No significant atrophy.  Quadriceps tone was good. Range of Motion: Greater than 120 degrees  Neurologic:  Awake, alert, and oriented.  Sensory function is intact to pinprick and light touch.  Motor strength is judged to be 5/5.  Motor coordination is within normal limits.  No apparent clonus. No tremor.   MRI: I reviewed the right knee MRI from [facility] dated 06/29/2019. I concur with the radiologist's interpretation as below:  MRI OF THE RIGHT KNEE WITHOUT CONTRAST   TECHNIQUE:  Multiplanar,  multisequence MR imaging of the knee was performed. No  intravenous contrast was administered.   COMPARISON: None.   FINDINGS:  MENISCI   Medial meniscus: Diminutive and irregular posterior horn medial  meniscus with grade 3 fissuring extending to the superior meniscal  surface in an oblique manner resulting in superior surface  irregularity, compatible with degenerative tearing. I do not observe  a definite bucket-handle component. Medial meniscal extrusion due to  loss of articular space in the medial  compartment. Indistinct  accentuated T2 and proton density weighted signal along the  periphery of the posterior horn medial meniscus with a 0.8 by 0.4 by  1.0 cm cystic lesion along this periphery for example on image 12/11  suggesting a small parameniscal cyst.   Lateral meniscus: Unremarkable   LIGAMENTS   Cruciates: Unremarkable   Collaterals: Mild thickening and accentuated signal in the proximal  MCL, query low-grade sprain. Fibular collateral ligament intact.   CARTILAGE   Patellofemoral: Full-thickness and near full-thickness loss of  articular cartilagecentrally along the femoral trochlear groove  with mild to moderate chondral thinning along the medial femoral  trochlear groove. Mild chondral thinning elsewhere in the  patellofemoral joint. Mild marginal spurring.   Medial: Prominent degenerative chondral thinning contributing to the  loss of compartmental space and meniscal extrusion. Subcortical  marrow edema along the medial femoral condyle and medial tibial  plateau.   Lateral: Mild degenerative chondral thinning.   Joint: Mild to moderate knee effusion. Superior plica and mildly  thickened medial plica.   Popliteal Fossa: Mild edema signal in the proximal soleus muscle  could reflect a mild soleus strain.   Extensor Mechanism: Prepatellar subcutaneous edema. Mild distal  patellar tendinopathy.   Bones: No significant extra-articular osseous abnormalities  identified.   Other: No supplemental non-categorized findings.   IMPRESSION:  1. Degenerative tearing of the posterior horn medial meniscus, with  grade 3 fissuring extending to thesuperior meniscal surface in an  oblique manner. Small parameniscal cyst along the periphery of the  posterior horn medial meniscus.  2. Prominent degenerative chondral thinning in the medial  compartment with associated subcortical marrow edema and resulting  meniscal extrusion.  3. Mild to moderate knee effusion  with superior plica and mildly  thickened medial plica.  4. Mild distal patellar tendinopathy.  5. Mild edema signal in the proximal soleus muscle could reflect a  mild soleus strain.  6. Mild thickening and accentuated signal in the proximal MCL, query  low-grade sprain.   Electronically Signed  By: Van Clines M.D.  On: 06/29/2019 09:54  Impression: Internal derangement of the right knee  Plan:  The findings were discussed in detail with the patient. The patient was given informational material on knee arthroscopy. Conservative treatment options were reviewed with the patient. We discussed the risks and benefits of surgical intervention. Arthroscopy is an appropriate treatment for the meniscal pathology, but would have limited or no effect on degenerative changes of the articular cartilage. The usual perioperative course was also discussed in detail. The patient expressed understanding of the risks and benefits of surgical intervention and would like to proceed with plans for right knee arthroscopy.  MEDICAL CLEARANCE: Per anesthesiology. ACTIVITIES:  Avoid pivoting, squatting, or twisting. WORK STATUS: Not applicable. THERAPY: Quadriceps strengthening exercises. MEDICATIONS: Requested Prescriptions   No prescriptions requested or ordered in this encounter   FOLLOW-UP: Return for preop History & Physical pending surgery date.   Brennan Karam P. Holley Bouche., M.D.  This note was generated in part with  voice recognition software and I apologize for any typographical errors that were not detected and corrected.   Electronically signed by Lamar Benes., MD at 08/13/2019 5:48 PM EDT

## 2019-08-28 ENCOUNTER — Ambulatory Visit
Admission: RE | Admit: 2019-08-28 | Discharge: 2019-08-28 | Disposition: A | Discharge status: Home / Self Care | Payer: Medicare Other | Attending: Orthopedic Surgery | Admitting: Orthopedic Surgery

## 2019-08-28 ENCOUNTER — Other Ambulatory Visit: Payer: Self-pay

## 2019-08-28 ENCOUNTER — Ambulatory Visit: Payer: Medicare Other | Admitting: Anesthesiology

## 2019-08-28 ENCOUNTER — Encounter: Payer: Self-pay | Admitting: Orthopedic Surgery

## 2019-08-28 ENCOUNTER — Encounter
Admission: RE | Disposition: A | Discharge status: Home / Self Care | Payer: Self-pay | Source: Home / Self Care | Attending: Orthopedic Surgery

## 2019-08-28 DIAGNOSIS — M23221 Derangement of posterior horn of medial meniscus due to old tear or injury, right knee: Secondary | ICD-10-CM | POA: Diagnosis not present

## 2019-08-28 DIAGNOSIS — M1990 Unspecified osteoarthritis, unspecified site: Secondary | ICD-10-CM | POA: Diagnosis not present

## 2019-08-28 DIAGNOSIS — Z9049 Acquired absence of other specified parts of digestive tract: Secondary | ICD-10-CM | POA: Diagnosis not present

## 2019-08-28 DIAGNOSIS — Z9889 Other specified postprocedural states: Secondary | ICD-10-CM

## 2019-08-28 DIAGNOSIS — Z8546 Personal history of malignant neoplasm of prostate: Secondary | ICD-10-CM | POA: Diagnosis not present

## 2019-08-28 DIAGNOSIS — Z79899 Other long term (current) drug therapy: Secondary | ICD-10-CM | POA: Diagnosis not present

## 2019-08-28 DIAGNOSIS — M94261 Chondromalacia, right knee: Secondary | ICD-10-CM | POA: Insufficient documentation

## 2019-08-28 DIAGNOSIS — E785 Hyperlipidemia, unspecified: Secondary | ICD-10-CM | POA: Diagnosis not present

## 2019-08-28 DIAGNOSIS — Z7982 Long term (current) use of aspirin: Secondary | ICD-10-CM | POA: Insufficient documentation

## 2019-08-28 DIAGNOSIS — Z9842 Cataract extraction status, left eye: Secondary | ICD-10-CM | POA: Diagnosis not present

## 2019-08-28 DIAGNOSIS — S83241A Other tear of medial meniscus, current injury, right knee, initial encounter: Secondary | ICD-10-CM | POA: Diagnosis present

## 2019-08-28 DIAGNOSIS — J449 Chronic obstructive pulmonary disease, unspecified: Secondary | ICD-10-CM | POA: Diagnosis not present

## 2019-08-28 DIAGNOSIS — Z87891 Personal history of nicotine dependence: Secondary | ICD-10-CM | POA: Diagnosis not present

## 2019-08-28 DIAGNOSIS — I10 Essential (primary) hypertension: Secondary | ICD-10-CM | POA: Insufficient documentation

## 2019-08-28 DIAGNOSIS — Z9841 Cataract extraction status, right eye: Secondary | ICD-10-CM | POA: Insufficient documentation

## 2019-08-28 HISTORY — PX: KNEE ARTHROSCOPY: SHX127

## 2019-08-28 SURGERY — ARTHROSCOPY, KNEE
Anesthesia: General | Site: Knee | Laterality: Right

## 2019-08-28 MED ORDER — CELECOXIB 200 MG PO CAPS
ORAL_CAPSULE | ORAL | Status: AC
  Administered 2019-08-28: 400 mg via ORAL
  Filled 2019-08-28: qty 2

## 2019-08-28 MED ORDER — LACTATED RINGERS IV SOLN: INTRAVENOUS | Status: DC

## 2019-08-28 MED ORDER — ACETAMINOPHEN 10 MG/ML IV SOLN
INTRAVENOUS | Status: DC | PRN
  Administered 2019-08-28: 1000 mg via INTRAVENOUS

## 2019-08-28 MED ORDER — CHLORHEXIDINE GLUCONATE 0.12 % MT SOLN: 15.0000 mL | Freq: Once | OROMUCOSAL | Status: AC

## 2019-08-28 MED ORDER — ONDANSETRON HCL 4 MG/2ML IJ SOLN
INTRAMUSCULAR | Status: AC
  Filled 2019-08-28: qty 2

## 2019-08-28 MED ORDER — ALBUTEROL SULFATE HFA 108 (90 BASE) MCG/ACT IN AERS
INHALATION_SPRAY | RESPIRATORY_TRACT | Status: AC
  Filled 2019-08-28: qty 6.7

## 2019-08-28 MED ORDER — BUPIVACAINE-EPINEPHRINE (PF) 0.25% -1:200000 IJ SOLN
INTRAMUSCULAR | Status: AC
  Filled 2019-08-28: qty 30

## 2019-08-28 MED ORDER — FAMOTIDINE 20 MG PO TABS: 20.0000 mg | ORAL_TABLET | Freq: Once | ORAL | Status: AC

## 2019-08-28 MED ORDER — HYDROCODONE-ACETAMINOPHEN 5-325 MG PO TABS: 1.0000 | ORAL_TABLET | ORAL | 0 refills | Status: DC | PRN

## 2019-08-28 MED ORDER — ORAL CARE MOUTH RINSE: 15.0000 mL | Freq: Once | OROMUCOSAL | Status: AC

## 2019-08-28 MED ORDER — EPHEDRINE SULFATE 50 MG/ML IJ SOLN
INTRAMUSCULAR | Status: DC | PRN
  Administered 2019-08-28: 5 mg via INTRAVENOUS

## 2019-08-28 MED ORDER — CELECOXIB 200 MG PO CAPS: 400.0000 mg | ORAL_CAPSULE | Freq: Once | ORAL | Status: AC

## 2019-08-28 MED ORDER — DEXAMETHASONE SODIUM PHOSPHATE 10 MG/ML IJ SOLN
INTRAMUSCULAR | Status: AC
  Filled 2019-08-28: qty 1

## 2019-08-28 MED ORDER — PROPOFOL 10 MG/ML IV BOLUS
INTRAVENOUS | Status: DC | PRN
  Administered 2019-08-28: 150 mg via INTRAVENOUS
  Administered 2019-08-28 (×2): 20 mg via INTRAVENOUS

## 2019-08-28 MED ORDER — PROPOFOL 10 MG/ML IV BOLUS
INTRAVENOUS | Status: AC
  Filled 2019-08-28: qty 20

## 2019-08-28 MED ORDER — SUCCINYLCHOLINE CHLORIDE 20 MG/ML IJ SOLN
INTRAMUSCULAR | Status: DC | PRN
  Administered 2019-08-28: 200 mg via INTRAVENOUS

## 2019-08-28 MED ORDER — PHENYLEPHRINE HCL (PRESSORS) 10 MG/ML IV SOLN
INTRAVENOUS | Status: DC | PRN
  Administered 2019-08-28 (×4): 100 ug via INTRAVENOUS
  Administered 2019-08-28 (×2): 50 ug via INTRAVENOUS
  Administered 2019-08-28 (×2): 100 ug via INTRAVENOUS

## 2019-08-28 MED ORDER — VASOPRESSIN 20 UNIT/ML IV SOLN
INTRAVENOUS | Status: AC
  Filled 2019-08-28: qty 1

## 2019-08-28 MED ORDER — BUPIVACAINE-EPINEPHRINE 0.25% -1:200000 IJ SOLN
INTRAMUSCULAR | Status: DC | PRN
  Administered 2019-08-28: 25 mL
  Administered 2019-08-28: 5 mL

## 2019-08-28 MED ORDER — FENTANYL CITRATE (PF) 100 MCG/2ML IJ SOLN
INTRAMUSCULAR | Status: DC | PRN
  Administered 2019-08-28: 50 ug via INTRAVENOUS
  Administered 2019-08-28 (×2): 25 ug via INTRAVENOUS
  Administered 2019-08-28: 50 ug via INTRAVENOUS

## 2019-08-28 MED ORDER — FENTANYL CITRATE (PF) 100 MCG/2ML IJ SOLN
INTRAMUSCULAR | Status: AC
  Filled 2019-08-28: qty 2

## 2019-08-28 MED ORDER — MORPHINE SULFATE 4 MG/ML IJ SOLN
INTRAMUSCULAR | Status: DC | PRN
  Administered 2019-08-28: 4 mg via INTRAMUSCULAR

## 2019-08-28 MED ORDER — VASOPRESSIN 20 UNIT/ML IV SOLN
INTRAVENOUS | Status: DC | PRN
Start: 2019-08-28 — End: 2019-08-28
  Administered 2019-08-28: 1 [IU] via INTRAVENOUS

## 2019-08-28 MED ORDER — ONDANSETRON HCL 4 MG/2ML IJ SOLN
INTRAMUSCULAR | Status: DC | PRN
  Administered 2019-08-28: 4 mg via INTRAVENOUS

## 2019-08-28 MED ORDER — LIDOCAINE HCL (CARDIAC) PF 100 MG/5ML IV SOSY
PREFILLED_SYRINGE | INTRAVENOUS | Status: DC | PRN
  Administered 2019-08-28: 80 mg via INTRAVENOUS

## 2019-08-28 MED ORDER — CHLORHEXIDINE GLUCONATE 0.12 % MT SOLN
OROMUCOSAL | Status: AC
  Administered 2019-08-28: 15 mL via OROMUCOSAL
  Filled 2019-08-28: qty 15

## 2019-08-28 MED ORDER — FAMOTIDINE 20 MG PO TABS
ORAL_TABLET | ORAL | Status: AC
  Administered 2019-08-28: 20 mg via ORAL
  Filled 2019-08-28: qty 1

## 2019-08-28 MED ORDER — LIDOCAINE HCL (PF) 2 % IJ SOLN
INTRAMUSCULAR | Status: AC
  Filled 2019-08-28: qty 5

## 2019-08-28 MED ORDER — SODIUM CHLORIDE 0.9 % IV SOLN
INTRAVENOUS | Status: DC | PRN
  Administered 2019-08-28: 30 ug/min via INTRAVENOUS

## 2019-08-28 MED ORDER — MORPHINE SULFATE (PF) 4 MG/ML IV SOLN
INTRAVENOUS | Status: AC
  Filled 2019-08-28: qty 1

## 2019-08-28 MED ORDER — EPHEDRINE 5 MG/ML INJ
INTRAVENOUS | Status: AC
  Filled 2019-08-28: qty 10

## 2019-08-28 MED ORDER — ACETAMINOPHEN 10 MG/ML IV SOLN
INTRAVENOUS | Status: AC
  Filled 2019-08-28: qty 100

## 2019-08-28 MED ORDER — DEXAMETHASONE SODIUM PHOSPHATE 10 MG/ML IJ SOLN
INTRAMUSCULAR | Status: DC | PRN
  Administered 2019-08-28: 5 mg via INTRAVENOUS

## 2019-08-28 MED ORDER — ALBUTEROL SULFATE HFA 108 (90 BASE) MCG/ACT IN AERS
INHALATION_SPRAY | RESPIRATORY_TRACT | Status: DC | PRN
Start: 2019-08-28 — End: 2019-08-28
  Administered 2019-08-28: 4 via RESPIRATORY_TRACT

## 2019-08-28 MED ORDER — DEXMEDETOMIDINE HCL IN NACL 400 MCG/100ML IV SOLN
INTRAVENOUS | Status: DC | PRN
  Administered 2019-08-28 (×2): 4 ug via INTRAVENOUS

## 2019-08-28 SURGICAL SUPPLY — 29 items
ADAPTER IRRIG TUBE 2 SPIKE SOL (ADAPTER) ×4 IMPLANT
ADPR TBG 2 SPK PMP STRL ASCP (ADAPTER) ×1
BLADE SHAVER 4.5 DBL SERAT CV (CUTTER) IMPLANT
COVER WAND RF STERILE (DRAPES) ×3 IMPLANT
CUFF TOURN SGL QUICK 24 (TOURNIQUET CUFF) ×3
CUFF TOURN SGL QUICK 30 (TOURNIQUET CUFF)
CUFF TRNQT CYL 24X4X16.5-23 (TOURNIQUET CUFF) IMPLANT
CUFF TRNQT CYL 30X4X21-28X (TOURNIQUET CUFF) IMPLANT
DRSG DERMACEA 8X12 NADH (GAUZE/BANDAGES/DRESSINGS) ×3 IMPLANT
DURAPREP 26ML APPLICATOR (WOUND CARE) ×6 IMPLANT
GAUZE SPONGE 4X4 12PLY STRL (GAUZE/BANDAGES/DRESSINGS) ×3 IMPLANT
GLOVE BIOGEL M STRL SZ7.5 (GLOVE) ×3 IMPLANT
GLOVE INDICATOR 8.0 STRL GRN (GLOVE) ×3 IMPLANT
GOWN STRL REUS W/ TWL LRG LVL3 (GOWN DISPOSABLE) ×2 IMPLANT
GOWN STRL REUS W/TWL LRG LVL3 (GOWN DISPOSABLE) ×6
IV LACTATED RINGER IRRG 3000ML (IV SOLUTION) ×24
IV LR IRRIG 3000ML ARTHROMATIC (IV SOLUTION) ×6 IMPLANT
KIT TURNOVER KIT A (KITS) ×3 IMPLANT
MANIFOLD NEPTUNE II (INSTRUMENTS) ×3 IMPLANT
PACK KNEE ARTHRO (MISCELLANEOUS) ×3 IMPLANT
SET TUBE SUCT SHAVER OUTFL 24K (TUBING) ×3 IMPLANT
SET TUBE TIP INTRA-ARTICULAR (MISCELLANEOUS) ×3 IMPLANT
SOL PREP PVP 2OZ (MISCELLANEOUS) ×3
SOLUTION PREP PVP 2OZ (MISCELLANEOUS) ×1 IMPLANT
SUT ETHILON 3-0 FS-10 30 BLK (SUTURE) ×3
SUTURE EHLN 3-0 FS-10 30 BLK (SUTURE) ×1 IMPLANT
TUBING ARTHRO INFLOW-ONLY STRL (TUBING) ×3 IMPLANT
WAND HAND CNTRL MULTIVAC 50 (MISCELLANEOUS) ×3 IMPLANT
WRAP KNEE W/COLD PACKS 25.5X14 (SOFTGOODS) ×3 IMPLANT

## 2019-08-28 NOTE — Anesthesia Preprocedure Evaluation (Signed)
Anesthesia Evaluation  Patient identified by MRN, date of birth, ID band Patient awake    Reviewed: Allergy & Precautions, H&P , NPO status , Patient's Chart, lab work & pertinent test results  History of Anesthesia Complications Negative for: history of anesthetic complications  Airway Mallampati: III  TM Distance: <3 FB Neck ROM: limited    Dental  (+) Chipped, Poor Dentition, Missing, Caps   Pulmonary asthma , COPD, former smoker,    Pulmonary exam normal        Cardiovascular Exercise Tolerance: Good hypertension, (-) angina(-) Past MI and (-) DOE Normal cardiovascular exam     Neuro/Psych negative neurological ROS  negative psych ROS   GI/Hepatic negative GI ROS, Neg liver ROS, neg GERD  ,  Endo/Other  negative endocrine ROS  Renal/GU Renal disease     Musculoskeletal  (+) Arthritis ,   Abdominal   Peds  Hematology negative hematology ROS (+)   Anesthesia Other Findings Past Medical History: No date: Arthritis No date: Cancer ([place])     Comment:  PROSTATE /COLON No date: Chronic kidney disease     Comment:  STAGE 3 No date: COPD (chronic obstructive pulmonary disease) (HCC)     Comment:  CHRONIC BRONCHITIS No date: Cough     Comment:  CHRONIC BRONCHITIS No date: Hypertension  Past Surgical History: 2020: BLEPHAROPLASTY; Bilateral No date: COLON SURGERY No date: EYE SURGERY     Comment:  Bilateral cataract surgery. No date: HERNIA REPAIR     Comment:  X 2 INGUINAL 08/27/2017: KNEE ARTHROSCOPY; Left     Comment:  Procedure: ARTHROSCOPY KNEE  PARTIAL MEDIAL MENISECTOMY               CHONDROPLASTY;  Surgeon: Dereck Leep, MD;  Location:               [facility];  Service: Orthopedics;  Laterality: Left; No date: PROSTATECTOMY 01/13/2018: SHOULDER ARTHROSCOPY WITH OPEN ROTATOR CUFF REPAIR; Right     Comment:  Procedure: SHOULDER ARTHROSCOPY WITH OPEN ROTATOR CUFF               REPAIR;  Surgeon:  Corky Mull, MD;  Location: [facility] ORS;              Service: Orthopedics;  Laterality: Right;  BMI    Body Mass Index: 30.13 kg/m      Reproductive/Obstetrics negative OB ROS                             Anesthesia Physical Anesthesia Plan  ASA: III  Anesthesia Plan: General LMA   Post-op Pain Management:    Induction: Intravenous  PONV Risk Score and Plan: Dexamethasone, Ondansetron, Midazolam and Treatment may vary due to age or medical condition  Airway Management Planned: LMA  Additional Equipment:   Intra-op Plan:   Post-operative Plan: Extubation in OR  Informed Consent: I have reviewed the patients History and Physical, chart, labs and discussed the procedure including the risks, benefits and alternatives for the proposed anesthesia with the patient or authorized representative who has indicated his/her understanding and acceptance.     Dental Advisory Given  Plan Discussed with: Anesthesiologist, CRNA and Surgeon  Anesthesia Plan Comments: (Patient consented for risks of anesthesia including but not limited to:  - adverse reactions to medications - damage to eyes, teeth, lips or other oral mucosa - nerve damage due to positioning  - sore throat or hoarseness -  Damage to heart, brain, nerves, lungs, other parts of body or loss of life  Patient voiced understanding.)        Anesthesia Quick Evaluation

## 2019-08-28 NOTE — H&P (Signed)
The patient has been re-examined, and the chart reviewed, and there have been no interval changes to the documented history and physical.    The risks, benefits, and alternatives have been discussed at length. The patient expressed understanding of the risks benefits and agreed with plans for surgical intervention.  Andreyah Natividad P. Carroll Ranney, Jr. M.D.    

## 2019-08-28 NOTE — Anesthesia Procedure Notes (Signed)
Procedure Name: Intubation Date/Time: 08/28/2019 5:04 PM Performed by: Jerrye Noble, CRNA Pre-anesthesia Checklist: Patient identified, Emergency Drugs available, Suction available and Patient being monitored Patient Re-evaluated:Patient Re-evaluated prior to induction Oxygen Delivery Method: Circle system utilized Preoxygenation: Pre-oxygenation with 100% oxygen Induction Type: IV induction Ventilation: Mask ventilation without difficulty Laryngoscope Size: McGraph and 4 Grade View: Grade I Tube type: Oral Tube size: 7.5 mm Number of attempts: 1 Airway Equipment and Method: Stylet,  Oral airway and Video-laryngoscopy Placement Confirmation: ETT inserted through vocal cords under direct vision,  positive ETCO2 and breath sounds checked- equal and bilateral Secured at: 23 cm Tube secured with: Tape Dental Injury: Teeth and Oropharynx as per pre-operative assessment

## 2019-08-28 NOTE — Transfer of Care (Signed)
Immediate Anesthesia Transfer of Care Note  Patient: NICKLOUS ABURTO  Procedure(s) Performed: ARTHROSCOPY KNEE (Right Knee)  Patient Location: PACU  Anesthesia Type:General  Level of Consciousness: sedated and patient cooperative  Airway & Oxygen Therapy: Patient Spontanous Breathing and Patient connected to face mask oxygen  Post-op Assessment: Report given to RN and Post -op Vital signs reviewed and stable  Post vital signs: Reviewed and stable  Last Vitals:  Vitals Value Taken Time  BP    Temp    Pulse 93 08/28/19 1849  Resp 17 08/28/19 1849  SpO2 96 % 08/28/19 1849  Vitals shown include unvalidated device data.  Last Pain:  Vitals:   08/28/19 1427  TempSrc: Tympanic  PainSc: 0-No pain         Complications: No complications documented.

## 2019-08-28 NOTE — Discharge Instructions (Signed)
AMBULATORY SURGERY  DISCHARGE INSTRUCTIONS   1) The drugs that you were given will stay in your system until tomorrow so for the next 24 hours you should not:  A) Drive an automobile B) Make any legal decisions C) Drink any alcoholic beverage   2) You may resume regular meals tomorrow.  Today it is better to start with liquids and gradually work up to solid foods.  You may eat anything you prefer, but it is better to start with liquids, then soup and crackers, and gradually work up to solid foods.   3) Please notify your doctor immediately if you have any unusual bleeding, trouble breathing, redness and pain at the surgery site, drainage, fever, or pain not relieved by medication. 4)   5) Your post-operative visit with Dr.                                     is: Date:                        Time:    Please call to schedule your post-operative visit.  6) Additional Instructions:       Instructions after Knee Arthroscopy    James P. Hooten, Jr., M.D.     Dept. of Orthopaedics & Sports Medicine  [facility]  [address]  [place], [place]  [address]   Phone: [phone]   Fax: [phone]   DIET: . Drink plenty of non-alcoholic fluids & begin a light diet. . Resume your normal diet the day after surgery.  ACTIVITY:  . You may use crutches or a walker with weight-bearing as tolerated, unless instructed otherwise. . You may wean yourself off of the walker or crutches as tolerated.  . Begin doing gentle exercises. Exercising will reduce the pain and swelling, increase motion, and prevent muscle weakness.   . Avoid strenuous activities or athletics for a minimum of 4-6 weeks after arthroscopic surgery. . Do not drive or operate any equipment until instructed.  WOUND CARE:  . Place one to two pillows under the knee the first day or two when sitting or lying.  . Continue to use the ice packs periodically to reduce pain and swelling. . The small incisions in  your knee are closed with nylon stitches. The stitches will be removed in the office. . The bulky dressing may be removed on the second day after surgery. DO NOT TOUCH THE STITCHES. Put a Band-Aid over each stitch. Do NOT use any ointments or creams on the incisions.  . You may bathe or shower after the stitches are removed at the first office visit following surgery.  MEDICATIONS: . You may resume your regular medications. . Please take the pain medication as prescribed. . Do not take pain medication on an empty stomach. . Do not drive or drink alcoholic beverages when taking pain medications.  CALL THE OFFICE FOR: . Temperature above 101 degrees . Excessive bleeding or drainage on the dressing. . Excessive swelling, coldness, or paleness of the toes. . Persistent nausea and vomiting.  FOLLOW-UP:  . You should have an appointment to return to the office in 7-10 days after surgery.      [facility] Department Directory         [email or web address]       [email or web address]          Cardiology  Appointments: [place] -   [phone] [place] - [phone]  Endocrinology  Appointments: [place] - [phone] [place] - [phone]  Gastroenterology  Appointments: [place] - [phone] [place] - [phone]        General Surgery   Appointments: [place] - [phone]  Internal Medicine/Family Medicine  Appointments: [place] - [phone] [place] - [phone] [place] - [phone]  Metabolic and Weigh Loss Surgery  Appointments: [place] - [phone]        Neurology  Appointments: [place] - [phone] [place] - [phone]  Neurosurgery  Appointments: [place] - [phone]  Obstetrics & Gynecology  Appointments: [place] - [phone] [place] - [phone]        Pediatrics  Appointments: [place] - [phone] [place] - [phone]  Physiatry  Appointments: [place]  [phone]  Physical Therapy  Appointments: [place] - [phone] [place] - [phone]        Podiatry  Appointments: [place] - [phone] [place] - [phone]  Pulmonology  Appointments: [place] - [phone]  Rheumatology  Appointments: [place] - [phone]         Location: [facility]  [address] , [place]  [address]  [place] Location: [facility] [address] [place], [place]  [address]  [place] Location: [facility] [address] [place], [place]  [address]    

## 2019-08-28 NOTE — Anesthesia Postprocedure Evaluation (Signed)
Anesthesia Post Note  Patient: Scott Branch  Procedure(s) Performed: ARTHROSCOPY KNEE (Right Knee)  Patient location during evaluation: PACU Anesthesia Type: General Level of consciousness: awake and alert Pain management: pain level controlled Vital Signs Assessment: post-procedure vital signs reviewed and stable Respiratory status: spontaneous breathing, nonlabored ventilation, respiratory function stable and patient connected to nasal cannula oxygen Cardiovascular status: blood pressure returned to baseline and stable Postop Assessment: no apparent nausea or vomiting Anesthetic complications: no   No complications documented.   Last Vitals:  Vitals:   08/28/19 1929 08/28/19 1930  BP: (!) 148/79 (!) 148/79  Pulse: 91 89  Resp:    Temp:    SpO2: 94% (!) 89%    Last Pain:  Vitals:   08/28/19 1917  TempSrc:   PainSc: 0-No pain                 Precious Haws Dellene Mcgroarty

## 2019-08-28 NOTE — Op Note (Signed)
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   ASSISTANT: none  ANESTHESIA: general  ESTIMATED BLOOD LOSS: Minimal  FLUIDS REPLACED: 600 mL of crystalloid  TOURNIQUET TIME: Not used  INDICATIONS FOR SURGERY: BRAEDAN MEUTH is a 84 y.o. year old male who has been seen for complaints of right knee pain. MRI demonstrated findings consistent with meniscal pathology. After discussion of the risks and benefits of surgical intervention, the patient expressed understanding of the risks benefits and agree with plans for right knee arthroscopy.   PROCEDURE IN DETAIL: The patient was brought into the operating room and, after adequate general anesthesia was achieved, a tourniquet was applied to the right thigh and the leg was placed in the leg holder. All bony prominences were well padded. The patient's right knee was cleaned and prepped with alcohol and Duraprep and draped in the usual sterile fashion. A "timeout" was performed as per usual protocol. The anticipated portal sites were injected with 0.25% Marcaine with epinephrine. An anterolateral incision was made and a cannula was inserted. A large effusion was evacuated and the knee was distended with fluid using the pump. The scope was advanced down the medial gutter into the medial compartment. Under visualization with the scope, an anteromedial portal was created and a hooked probe was inserted. The medial meniscus was visualized and probed.  There was a complex tear of the posterior horn of the medial meniscus.  The  tear was debrided using meniscal punches and a 4.5 mm incisor shaver.  Final contouring was performed using a 50 degree ArthroCare wand.  The remaining rim of meniscus was visualized and probed and felt to be stable.  The articular cartilage was visualized.  There were grade 3 changes of chondromalacia involving primarily the medial femoral condyle and less so the medial tibial plateau.  These areas were debrided and contoured using the ArthroCare wand.  The scope was then advanced into the intercondylar notch. The anterior cruciate ligament was visualized and probed and felt to be intact. The scope was removed from the lateral portal and reinserted via the anteromedial portal to better visualize the lateral compartment. The lateral meniscus was visualized and probed.  The meniscus was intact with only minimal fraying.  The articular cartilage of the lateral compartment was visualized.  The articular cartilage was in reasonably good condition.  Finally, the scope was advanced so as to visualize the patellofemoral articulation. Good patellar tracking was appreciated.  Mild chondromalacia was noted to the intercondylar sulcus.  Several areas were debrided using the ArthroCare wand.  The knee was irrigated with copius amounts of fluid and suctioned dry. The anterolateral portal was re-approximated with #3-0 nylon. A combination of 0.25% Marcaine with epinephrine and 4 mg of Morphine were injected via the scope. The scope was removed and the anteromedial portal was re-approximated with #3-0 nylon. A sterile dressing was applied followed by application of an ice wrap.  The patient tolerated the procedure well and was transported to the PACU in stable condition.  Briellah Baik P. Holley Bouche., M.D.

## 2019-08-29 ENCOUNTER — Encounter: Payer: Self-pay | Admitting: Orthopedic Surgery

## 2019-12-06 ENCOUNTER — Ambulatory Visit: Payer: Medicare Other | Admitting: Dermatology

## 2019-12-06 ENCOUNTER — Other Ambulatory Visit: Payer: Self-pay

## 2019-12-06 ENCOUNTER — Encounter: Payer: Self-pay | Admitting: Dermatology

## 2019-12-06 DIAGNOSIS — D229 Melanocytic nevi, unspecified: Secondary | ICD-10-CM | POA: Diagnosis not present

## 2019-12-06 DIAGNOSIS — D1800 Hemangioma unspecified site: Secondary | ICD-10-CM

## 2019-12-06 DIAGNOSIS — Z1283 Encounter for screening for malignant neoplasm of skin: Secondary | ICD-10-CM

## 2019-12-06 DIAGNOSIS — L820 Inflamed seborrheic keratosis: Secondary | ICD-10-CM

## 2019-12-06 DIAGNOSIS — L578 Other skin changes due to chronic exposure to nonionizing radiation: Secondary | ICD-10-CM

## 2019-12-06 DIAGNOSIS — L821 Other seborrheic keratosis: Secondary | ICD-10-CM

## 2019-12-06 DIAGNOSIS — L814 Other melanin hyperpigmentation: Secondary | ICD-10-CM

## 2019-12-06 DIAGNOSIS — L570 Actinic keratosis: Secondary | ICD-10-CM | POA: Diagnosis not present

## 2019-12-06 NOTE — Progress Notes (Addendum)
Follow-Up Visit   Subjective  Scott Branch is a 84 y.o. male who presents for the following: Upper body skin exam (upper body, hx of AKs), growth (R upper arm, ~23m, no symptoms), Actinic Keratosis (face, 50m f/u LN2 x 9 last visit), and ISK (face, 67m f/u, LN2 x 7 last visit). The patient presents for Upper Body Skin Exam (UBSE) for skin cancer screening and mole check.  The following portions of the chart were reviewed this encounter and updated as appropriate:  Tobacco  Allergies  Meds  Problems  Med Hx  Surg Hx  Fam Hx     Review of Systems:  No other skin or systemic complaints except as noted in HPI or Assessment and Plan.  Objective  Well appearing patient in no apparent distress; mood and affect are within normal limits.  All skin waist up examined.  Objective  Right Upper Arm  x 2, L forehead x 1 (3): Erythematous keratotic or waxy stuck-on papule or plaque.   Objective  face, ears x 8 (8): Pink scaly macules    Assessment & Plan    Lentigines - Scattered tan macules - Discussed due to sun exposure - Benign, observe - Call for any changes  Seborrheic Keratoses - Stuck-on, waxy, tan-brown papules and plaques  - Discussed benign etiology and prognosis. - Observe - Call for any changes  Melanocytic Nevi - Tan-brown and/or pink-flesh-colored symmetric macules and papules - Benign appearing on exam today - Observation - Call clinic for new or changing moles - Recommend daily use of broad spectrum spf 30+ sunscreen to sun-exposed areas.   Hemangiomas - Red papules - Discussed benign nature - Observe - Call for any changes  Actinic Damage - Chronic, secondary to cumulative UV/sun exposure - diffuse scaly erythematous macules with underlying dyspigmentation - Recommend daily broad spectrum sunscreen SPF 30+ to sun-exposed areas, reapply every 2 hours as needed.  - Call for new or changing lesions.  Severe, Chronic Actinic Changes with  PreCancerous Actinic Keratoses: - Discussed "Field Treatment" for Severe, Confluent Actinic Changes with Pre-Cancerous Actinic Keratoses due to cumulative sun exposure/UV radiation exposure over time Field treatment involves treatment of an entire area of skin that has confluent Actinic Changes (Sun/ Ultraviolet light damage) and PreCancerous Actinic Keratoses by method of PhotoDynamic Therapy (PDT) and/or prescription Topical Chemotherapy agents such as 5-fluorouracil, 5-fluorouracil/calcipotriene, and/or imiquimod.  The purpose is to decrease the number of clinically evident and subclinical PreCancerous lesions to prevent progression to development of skin cancer by chemically destroying early precancer changes that may or may not be visible.  It has been shown to reduce the risk of developing skin cancer in the treated area. As a result of treatment, redness, scaling, crusting, and open sores may occur during treatment course. One or more than one of these methods may be used and may have to be used several times to control, suppress and eliminate the PreCancerous changes. Discussed treatment course, expected reaction, and possible side effects.  May plan topical or PDT on follow up to face.  Skin cancer screening performed today.  Inflamed seborrheic keratosis (3) Right Upper Arm  x 2, L forehead x 1  Destruction of lesion - Right Upper Arm  x 2, L forehead x 1 Complexity: simple   Destruction method: cryotherapy   Informed consent: discussed and consent obtained   Timeout:  patient name, date of birth, surgical site, and procedure verified Lesion destroyed using liquid nitrogen: Yes   Region frozen until  ice ball extended beyond lesion: Yes   Outcome: patient tolerated procedure well with no complications   Post-procedure details: wound care instructions given    AK (actinic keratosis) (8) face, ears x 8  Plan  PDT with ALA in January 2022  Destruction of lesion - face, ears x  8 Complexity: simple   Destruction method: cryotherapy   Informed consent: discussed and consent obtained   Timeout:  patient name, date of birth, surgical site, and procedure verified Lesion destroyed using liquid nitrogen: Yes   Region frozen until ice ball extended beyond lesion: Yes   Outcome: patient tolerated procedure well with no complications   Post-procedure details: wound care instructions given    Skin cancer screening  Return in about 2 months (around 02/05/2020) for PDT face, 44m for AK f/u.  I, Scott Branch, RMA, am acting as scribe for Scott Branch .  Documentation: I have reviewed the above documentation for accuracy and completeness, and I agree with the above.  Scott Branch

## 2020-02-07 ENCOUNTER — Other Ambulatory Visit: Payer: Self-pay

## 2020-02-07 ENCOUNTER — Ambulatory Visit (INDEPENDENT_AMBULATORY_CARE_PROVIDER_SITE_OTHER): Payer: Medicare Other

## 2020-02-07 DIAGNOSIS — L570 Actinic keratosis: Secondary | ICD-10-CM | POA: Diagnosis not present

## 2020-02-07 MED ORDER — AMINOLEVULINIC ACID HCL 20 % EX SOLR
1.0000 "application " | Freq: Once | CUTANEOUS | Status: AC
  Administered 2020-02-07: 354 mg via TOPICAL

## 2020-02-07 NOTE — Progress Notes (Signed)
1. AK (actinic keratosis) Face  Photodynamic therapy - Face Procedure discussed: discussed risks, benefits, side effects. and alternatives   Prep: site scrubbed/prepped with acetone   Location:  Face Number of lesions:  Multiple Type of treatment:  Blue light Aminolevulinic Acid (see MAR for details): Levulan Number of Levulan sticks used:  1 Incubation time (minutes):  60 Number of minutes under lamp:  16 Number of seconds under lamp:  40 Cooling:  Floor fan Outcome: patient tolerated procedure well with no complications   Post-procedure details: sunscreen applied and aftercare instructions given to patient    Aminolevulinic Acid HCl 20 % SOLR 354 mg - Face    

## 2020-02-07 NOTE — Patient Instructions (Signed)

## 2020-02-20 ENCOUNTER — Other Ambulatory Visit: Payer: Self-pay | Admitting: Internal Medicine

## 2020-02-20 DIAGNOSIS — R918 Other nonspecific abnormal finding of lung field: Secondary | ICD-10-CM

## 2020-03-06 ENCOUNTER — Other Ambulatory Visit: Payer: Self-pay

## 2020-03-06 ENCOUNTER — Ambulatory Visit
Admission: RE | Admit: 2020-03-06 | Discharge: 2020-03-06 | Disposition: A | Discharge status: Home / Self Care | Payer: Medicare Other | Source: Ambulatory Visit | Attending: Internal Medicine | Admitting: Internal Medicine

## 2020-03-06 DIAGNOSIS — R918 Other nonspecific abnormal finding of lung field: Secondary | ICD-10-CM | POA: Insufficient documentation

## 2020-03-06 HISTORY — DX: Unspecified asthma, uncomplicated: J45.909

## 2020-03-06 MED ORDER — IOHEXOL 300 MG/ML  SOLN
60.0000 mL | Freq: Once | INTRAMUSCULAR | Status: AC | PRN
  Administered 2020-03-06: 60 mL via INTRAVENOUS

## 2020-03-08 DIAGNOSIS — I700 Atherosclerosis of aorta: Secondary | ICD-10-CM | POA: Insufficient documentation

## 2020-04-17 ENCOUNTER — Ambulatory Visit: Payer: Medicare Other | Admitting: Dermatology

## 2020-04-17 ENCOUNTER — Other Ambulatory Visit: Payer: Self-pay

## 2020-04-17 DIAGNOSIS — L820 Inflamed seborrheic keratosis: Secondary | ICD-10-CM | POA: Diagnosis not present

## 2020-04-17 DIAGNOSIS — L578 Other skin changes due to chronic exposure to nonionizing radiation: Secondary | ICD-10-CM

## 2020-04-17 DIAGNOSIS — L821 Other seborrheic keratosis: Secondary | ICD-10-CM

## 2020-04-17 DIAGNOSIS — L570 Actinic keratosis: Secondary | ICD-10-CM | POA: Diagnosis not present

## 2020-04-17 NOTE — Patient Instructions (Signed)

## 2020-04-17 NOTE — Progress Notes (Signed)
   Follow-Up Visit   Subjective  Scott Branch is a 85 y.o. male who presents for the following: Actinic Keratosis (Of the face and ears S/P PDT on the face - check for new or persistent skin lesions).  The following portions of the chart were reviewed this encounter and updated as appropriate:   Tobacco  Allergies  Meds  Problems  Med Hx  Surg Hx  Fam Hx     Review of Systems:  No other skin or systemic complaints except as noted in HPI or Assessment and Plan.  Objective  Well appearing patient in no apparent distress; mood and affect are within normal limits.  A focused examination was performed including the face and ears. Relevant physical exam findings are noted in the Assessment and Plan.  Objective  Face x 3, R ear x 2, L ear x 3, and scalp x 2 (10): Erythematous thin papules/macules with gritty scale.   Objective  Face (19): Erythematous keratotic or waxy stuck-on papule or plaque.   Assessment & Plan  AK (actinic keratosis) (10) Face x 3, R ear x 2, L ear x 3, and scalp x 2  Destruction of lesion - Face x 3, R ear x 2, L ear x 3, and scalp x 2 Complexity: simple   Destruction method: cryotherapy   Informed consent: discussed and consent obtained   Timeout:  patient name, date of birth, surgical site, and procedure verified Lesion destroyed using liquid nitrogen: Yes   Region frozen until ice ball extended beyond lesion: Yes   Outcome: patient tolerated procedure well with no complications   Post-procedure details: wound care instructions given    Inflamed seborrheic keratosis (19) Face  Destruction of lesion - Face Complexity: simple   Destruction method: cryotherapy   Informed consent: discussed and consent obtained   Timeout:  patient name, date of birth, surgical site, and procedure verified Lesion destroyed using liquid nitrogen: Yes   Region frozen until ice ball extended beyond lesion: Yes   Outcome: patient tolerated procedure well with no  complications   Post-procedure details: wound care instructions given    Actinic Damage - chronic, secondary to cumulative UV radiation exposure/sun exposure over time - diffuse scaly erythematous macules with underlying dyspigmentation - Recommend daily broad spectrum sunscreen SPF 30+ to sun-exposed areas, reapply every 2 hours as needed.  - Recommend staying in the shade or wearing long sleeves, sun glasses (UVA+UVB protection) and wide brim hats (4-inch brim around the entire circumference of the hat). - Call for new or changing lesions.  Seborrheic Keratoses - Stuck-on, waxy, tan-brown papules and plaques  - Discussed benign etiology and prognosis. - Observe - Call for any changes  Return in about 6 months (around 10/18/2020) for AK follow up .  Luther Redo, CMA, am acting as scribe for Sarina Ser, MD .  Documentation: I have reviewed the above documentation for accuracy and completeness, and I agree with the above.  Sarina Ser, MD

## 2020-04-23 ENCOUNTER — Encounter: Payer: Self-pay | Admitting: Dermatology

## 2020-10-09 ENCOUNTER — Other Ambulatory Visit: Payer: Self-pay

## 2020-10-09 ENCOUNTER — Ambulatory Visit: Payer: Medicare Other | Admitting: Dermatology

## 2020-10-09 DIAGNOSIS — L578 Other skin changes due to chronic exposure to nonionizing radiation: Secondary | ICD-10-CM | POA: Diagnosis not present

## 2020-10-09 DIAGNOSIS — L570 Actinic keratosis: Secondary | ICD-10-CM

## 2020-10-09 DIAGNOSIS — L820 Inflamed seborrheic keratosis: Secondary | ICD-10-CM

## 2020-10-09 DIAGNOSIS — L821 Other seborrheic keratosis: Secondary | ICD-10-CM | POA: Diagnosis not present

## 2020-10-09 NOTE — Progress Notes (Signed)
Follow-Up Visit   Subjective  Scott Branch is a 85 y.o. male who presents for the following: Actinic Keratosis (Of the face, ears, and scalp - S/P LN2). He has noticed a lesion on his R arm that he would like treated today.   The following portions of the chart were reviewed this encounter and updated as appropriate:   Tobacco  Allergies  Meds  Problems  Med Hx  Surg Hx  Fam Hx     Review of Systems:  No other skin or systemic complaints except as noted in HPI or Assessment and Plan.  Objective  Well appearing patient in no apparent distress; mood and affect are within normal limits.  A focused examination was performed including the face, scalp, ears, arms, and hands. Relevant physical exam findings are noted in the Assessment and Plan.  Near the right elbow x 1 Erythematous keratotic or waxy stuck-on papule or plaque.   Face and ears x 15 (15) Erythematous thin papules/macules with gritty scale.    Assessment & Plan  Inflamed seborrheic keratosis Near the right elbow x 1  Destruction of lesion - Near the right elbow x 1 Complexity: simple   Destruction method: cryotherapy   Informed consent: discussed and consent obtained   Timeout:  patient name, date of birth, surgical site, and procedure verified Lesion destroyed using liquid nitrogen: Yes   Region frozen until ice ball extended beyond lesion: Yes   Outcome: patient tolerated procedure well with no complications   Post-procedure details: wound care instructions given    AK (actinic keratosis) (15) Face and ears x 15  Destruction of lesion - Face and ears x 15 Complexity: simple   Destruction method: cryotherapy   Informed consent: discussed and consent obtained   Timeout:  patient name, date of birth, surgical site, and procedure verified Lesion destroyed using liquid nitrogen: Yes   Region frozen until ice ball extended beyond lesion: Yes   Outcome: patient tolerated procedure well with no  complications   Post-procedure details: wound care instructions given    Actinic Damage - Severe, confluent actinic changes with pre-cancerous actinic keratoses  - Severe, chronic, not at goal, secondary to cumulative UV radiation exposure over time - diffuse scaly erythematous macules and papules with underlying dyspigmentation - Discussed Prescription "Field Treatment" for Severe, Chronic Confluent Actinic Changes with Pre-Cancerous Actinic Keratoses Field treatment involves treatment of an entire area of skin that has confluent Actinic Changes (Sun/ Ultraviolet light damage) and PreCancerous Actinic Keratoses by method of PhotoDynamic Therapy (PDT) and/or prescription Topical Chemotherapy agents such as 5-fluorouracil, 5-fluorouracil/calcipotriene, and/or imiquimod.  The purpose is to decrease the number of clinically evident and subclinical PreCancerous lesions to prevent progression to development of skin cancer by chemically destroying early precancer changes that may or may not be visible.  It has been shown to reduce the risk of developing skin cancer in the treated area. As a result of treatment, redness, scaling, crusting, and open sores may occur during treatment course. One or more than one of these methods may be used and may have to be used several times to control, suppress and eliminate the PreCancerous changes. Discussed treatment course, expected reaction, and possible side effects. - Recommend daily broad spectrum sunscreen SPF 30+ to sun-exposed areas, reapply every 2 hours as needed.  - Staying in the shade or wearing long sleeves, sun glasses (UVA+UVB protection) and wide brim hats (4-inch brim around the entire circumference of the hat) are also recommended. - Call  for new or changing lesions.  Seborrheic Keratoses - Stuck-on, waxy, tan-brown papules and/or plaques  - Benign-appearing - Discussed benign etiology and prognosis. - Observe - Call for any changes  Return for 4  mths for PDT of the face; f/u in 6 mths.  Luther Redo, CMA, am acting as scribe for Sarina Ser, MD . Documentation: I have reviewed the above documentation for accuracy and completeness, and I agree with the above.  Sarina Ser, MD

## 2020-10-09 NOTE — Patient Instructions (Signed)

## 2020-10-12 ENCOUNTER — Encounter: Payer: Self-pay | Admitting: Dermatology

## 2020-12-14 IMAGING — MR MR KNEE*R* W/O CM
6 series · 40 of 40 positions shown · non-contrast
Comparison: None.

CLINICAL DATA: Right knee pain, possibly injured while climbing a
ladder, and beginning 2 months ago.

EXAM:
MRI OF THE RIGHT KNEE WITHOUT CONTRAST
TECHNIQUE: Multiplanar, multisequence MR imaging of the knee was performed. No
intravenous contrast was administered.

[Series 9: T1 · coronal · right · 4.0mm · 0.39mm/px · 6 of 32 slices shown]
[im 1/32]
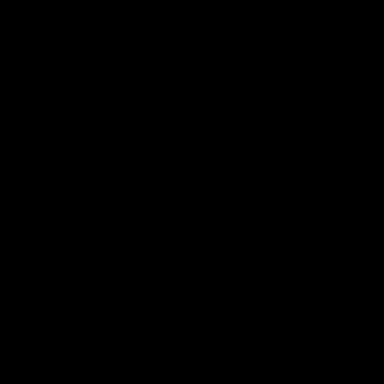
[im 7/32]
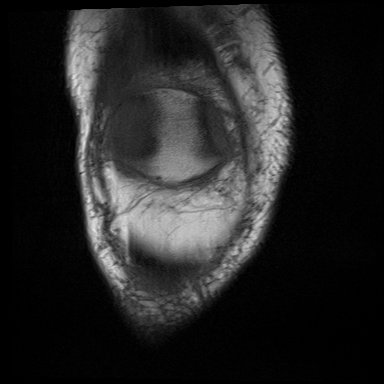
[im 13/32]
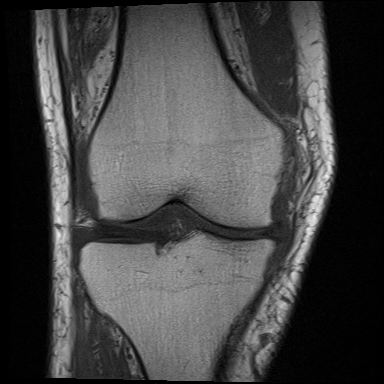
[im 19/32]
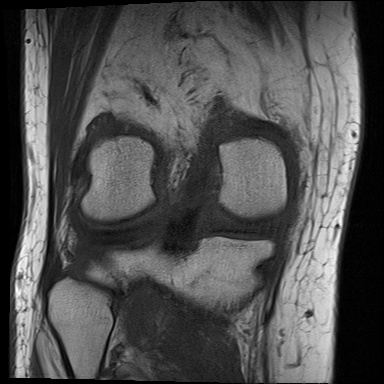
[im 25/32]
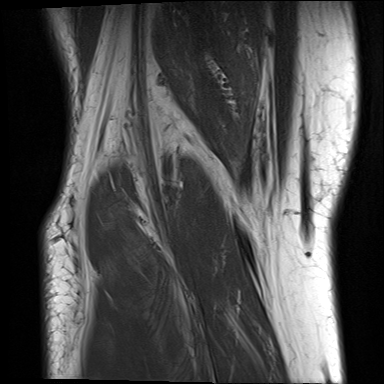
[im 32/32]
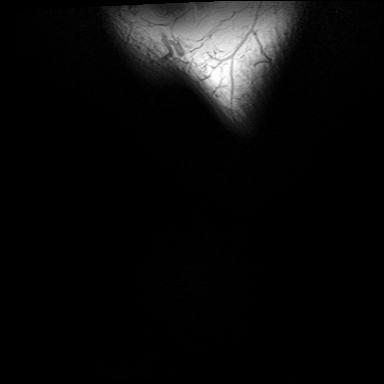

[Series 10: T2 fat-sat · coronal · right · 4.0mm · 0.59mm/px · 7 of 31 slices shown (1 of 3)]
[im 1/31]
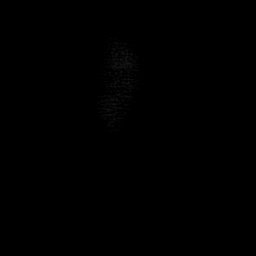
[im 6/31]
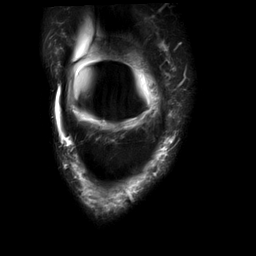
[im 11/31]
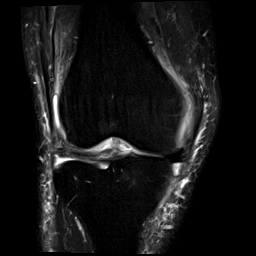
[im 16/31]
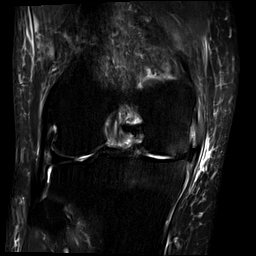
[im 21/31]
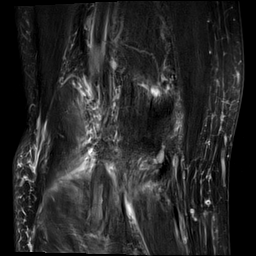
[im 26/31]
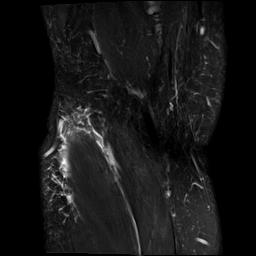
[im 31/31]
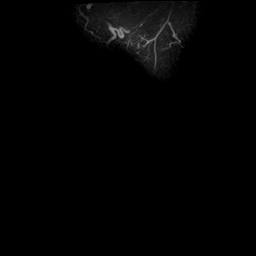

[Series 11: PD fat-sat · coronal · right · 4.0mm · 0.59mm/px · 7 of 32 slices shown (1 of 2)]
[im 1/32]
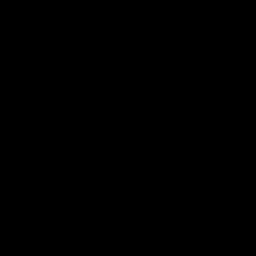
[im 6/32]
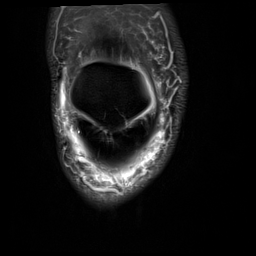
[im 11/32]
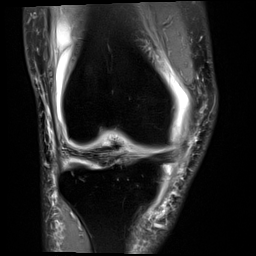
[im 16/32]
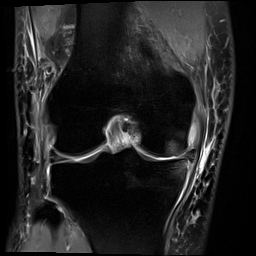
[im 21/32]
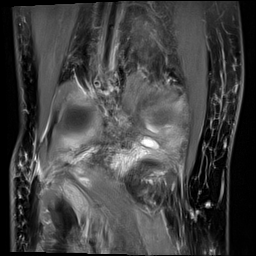
[im 26/32]
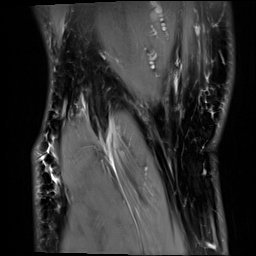
[im 32/32]
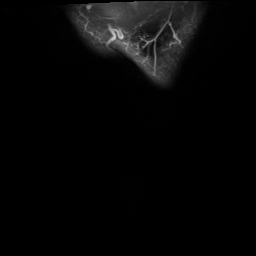

[Series 12: PD fat-sat · sagittal · right · 3.0mm · 0.59mm/px · 7 of 33 slices shown (2 of 2)]
[im 1/33]
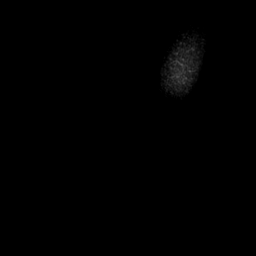
[im 6/33]
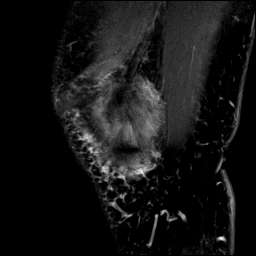
[im 11/33]
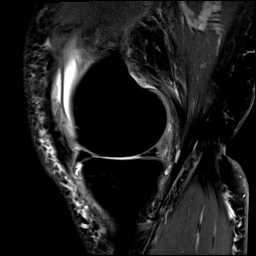
[im 17/33]
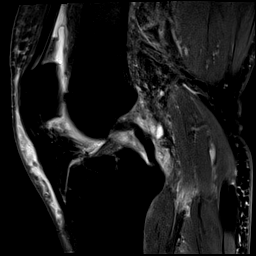
[im 22/33]
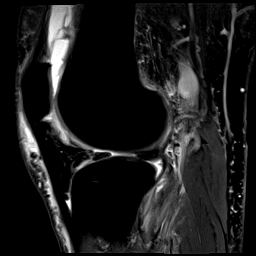
[im 27/33]
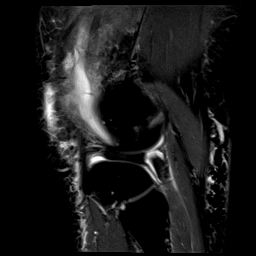
[im 33/33]
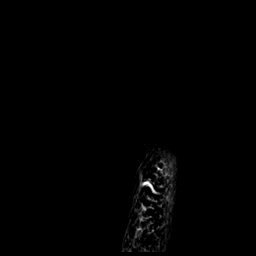

[Series 13: T2 fat-sat · sagittal · right · 3.0mm · 0.59mm/px · 8 of 36 slices shown (2 of 3)]
[im 1/36]
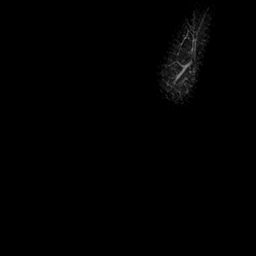
[im 6/36]
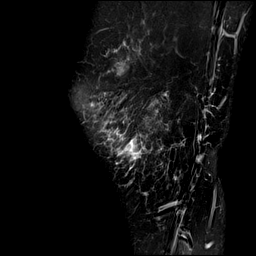
[im 11/36]
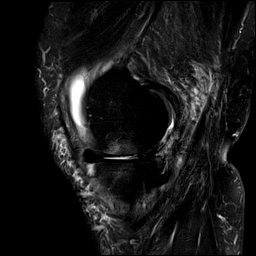
[im 16/36]
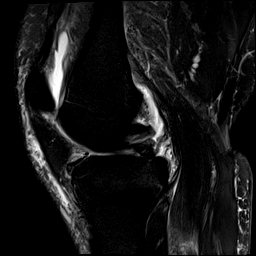
[im 21/36]
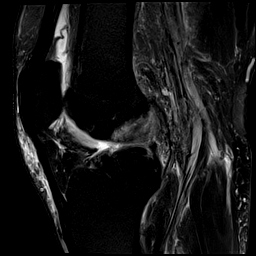
[im 26/36]
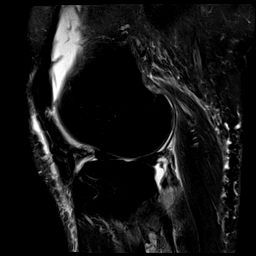
[im 31/36]
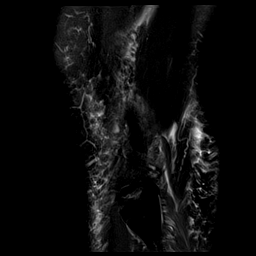
[im 36/36]
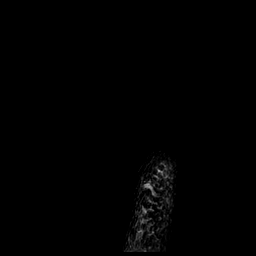

[Series 14: T2 fat-sat · axial · right · 4.0mm · 0.50mm/px · z∈[-54,+71]mm · 5 of 26 slices shown (3 of 3)]
[im 1/26]
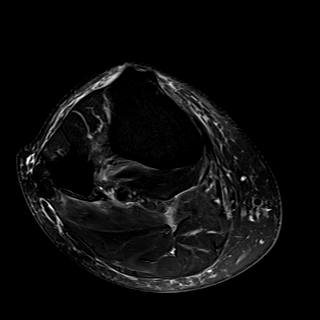
[im 7/26]
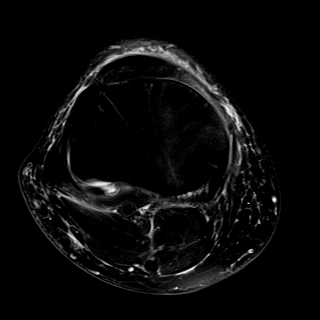
[im 13/26]
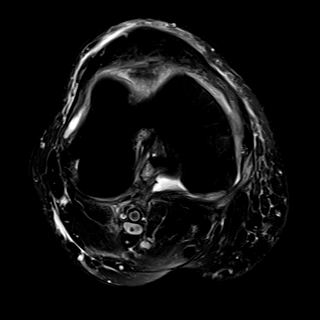
[im 19/26]
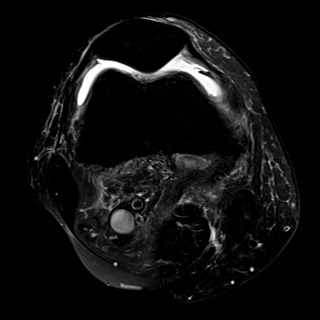
[im 26/26]
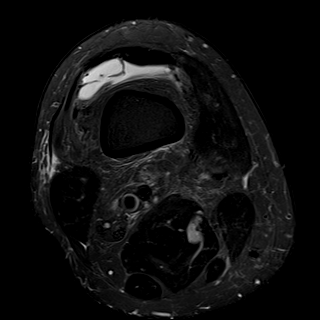

[40 of 40 positions shown; findings below may reference images not displayed]

FINDINGS: MENISCI

Medial meniscus: Diminutive and irregular posterior horn medial
meniscus with grade 3 fissuring extending to the superior meniscal
surface in an oblique manner resulting in superior surface
irregularity, compatible with degenerative tearing. I do not observe
a definite bucket-handle component. Medial meniscal extrusion due to
loss of articular space in the medial compartment. Indistinct
accentuated T2 and proton density weighted signal along the
periphery of the posterior horn medial meniscus with a 0.8 by 0.4 by
1.0 cm cystic lesion along this periphery for example on image [DATE]
suggesting a small parameniscal cyst.

Lateral meniscus:  Unremarkable

LIGAMENTS

Cruciates:  Unremarkable

Collaterals: Mild thickening and accentuated signal in the proximal
MCL, query low-grade sprain. Fibular collateral ligament intact.

CARTILAGE

Patellofemoral: Full-thickness and near full-thickness loss of
articular cartilage centrally along the femoral trochlear groove
with mild to moderate chondral thinning along the medial femoral
trochlear groove. Mild chondral thinning elsewhere in the
patellofemoral joint. Mild marginal spurring.

Medial: Prominent degenerative chondral thinning contributing to the
loss of compartmental space and meniscal extrusion. Subcortical
marrow edema along the medial femoral condyle and medial tibial
plateau.

Lateral:  Mild degenerative chondral thinning.

Joint: Mild to moderate knee effusion. Superior plica and mildly
thickened medial plica.

Popliteal Fossa: Mild edema signal in the proximal soleus muscle
could reflect a mild soleus strain.

Extensor Mechanism: Prepatellar subcutaneous edema. Mild distal
patellar tendinopathy.

Bones: No significant extra-articular osseous abnormalities
identified.

Other: No supplemental non-categorized findings.
IMPRESSION: 1. Degenerative tearing of the posterior horn medial meniscus, with
grade 3 fissuring extending to the superior meniscal surface in an
oblique manner. Small parameniscal cyst along the periphery of the
posterior horn medial meniscus.
2. Prominent degenerative chondral thinning in the medial
compartment with associated subcortical marrow edema and resulting
meniscal extrusion.
3. Mild to moderate knee effusion with superior plica and mildly
thickened medial plica.
4. Mild distal patellar tendinopathy.
5. Mild edema signal in the proximal soleus muscle could reflect a
mild soleus strain.
6. Mild thickening and accentuated signal in the proximal MCL, query
low-grade sprain.

## 2021-02-05 ENCOUNTER — Other Ambulatory Visit: Payer: Self-pay

## 2021-02-05 ENCOUNTER — Ambulatory Visit: Payer: Medicare Other

## 2021-02-05 DIAGNOSIS — L570 Actinic keratosis: Secondary | ICD-10-CM | POA: Diagnosis not present

## 2021-02-05 MED ORDER — AMINOLEVULINIC ACID HCL 20 % EX SOLR
1.0000 "application " | Freq: Once | CUTANEOUS | Status: AC
  Administered 2021-02-05: 354 mg via TOPICAL

## 2021-02-05 NOTE — Patient Instructions (Signed)

## 2021-02-05 NOTE — Progress Notes (Signed)
Patient completed PDT therapy today.  1. AK (actinic keratosis) Head - Anterior (Face)  Photodynamic therapy - Head - Anterior (Face) Procedure discussed: discussed risks, benefits, side effects. and alternatives   Prep: site scrubbed/prepped with acetone   Location:  Face Number of lesions:  Multiple Type of treatment:  Blue light Aminolevulinic Acid (see MAR for details): Levulan Number of Levulan sticks used:  1 Incubation time (minutes):  60 Number of minutes under lamp:  16 Number of seconds under lamp:  40 Cooling:  Floor fan Outcome: patient tolerated procedure well with no complications   Post-procedure details: sunscreen applied    Aminolevulinic Acid HCl 20 % SOLR 354 mg - Head - Anterior (Face)   Patient given samples of Solbar Sunscreen and Vanicream moisturizer.

## 2021-04-16 ENCOUNTER — Ambulatory Visit: Payer: Medicare Other | Admitting: Dermatology

## 2021-04-16 ENCOUNTER — Other Ambulatory Visit: Payer: Self-pay

## 2021-04-16 DIAGNOSIS — L570 Actinic keratosis: Secondary | ICD-10-CM

## 2021-04-16 DIAGNOSIS — L578 Other skin changes due to chronic exposure to nonionizing radiation: Secondary | ICD-10-CM

## 2021-04-16 DIAGNOSIS — L820 Inflamed seborrheic keratosis: Secondary | ICD-10-CM | POA: Diagnosis not present

## 2021-04-16 NOTE — Progress Notes (Signed)
? ?  Follow-Up Visit ?  ?Subjective  ?Scott Branch is a 86 y.o. male who presents for the following: Actinic Keratosis (S/P PDT to the face in January - patient is here today to check for new or changing precancerous skin lesions.). ?The patient has spots, moles and lesions to be evaluated, some may be new or changing and the patient has concerns that these could be cancer. ? ?The following portions of the chart were reviewed this encounter and updated as appropriate:  ? Tobacco  Allergies  Meds  Problems  Med Hx  Surg Hx  Fam Hx   ?  ?Review of Systems:  No other skin or systemic complaints except as noted in HPI or Assessment and Plan. ? ?Objective  ?Well appearing patient in no apparent distress; mood and affect are within normal limits. ? ?A focused examination was performed including the face, ears, and scalp. Relevant physical exam findings are noted in the Assessment and Plan. ? ?Face and ears x 9 (9) ?Erythematous thin papules/macules with gritty scale.  ? ?Forehead x 1, ear x 1 (2) ?Erythematous stuck-on, waxy papule or plaque ? ? ?Assessment & Plan  ?AK (actinic keratosis) (9) ?Face and ears x 9 ? ?Destruction of lesion - Face and ears x 9 ?Complexity: simple   ?Destruction method: cryotherapy   ?Informed consent: discussed and consent obtained   ?Timeout:  patient name, date of birth, surgical site, and procedure verified ?Lesion destroyed using liquid nitrogen: Yes   ?Region frozen until ice ball extended beyond lesion: Yes   ?Outcome: patient tolerated procedure well with no complications   ?Post-procedure details: wound care instructions given   ? ?Inflamed seborrheic keratosis (2) ?Forehead x 1, ear x 1 ? ?Destruction of lesion - Forehead x 1, ear x 1 ?Complexity: simple   ?Destruction method: cryotherapy   ?Informed consent: discussed and consent obtained   ?Timeout:  patient name, date of birth, surgical site, and procedure verified ?Lesion destroyed using liquid nitrogen: Yes   ?Region  frozen until ice ball extended beyond lesion: Yes   ?Outcome: patient tolerated procedure well with no complications   ?Post-procedure details: wound care instructions given   ? ?Actinic Damage ?- chronic, secondary to cumulative UV radiation exposure/sun exposure over time ?- diffuse scaly erythematous macules with underlying dyspigmentation ?- Recommend daily broad spectrum sunscreen SPF 30+ to sun-exposed areas, reapply every 2 hours as needed.  ?- Recommend staying in the shade or wearing long sleeves, sun glasses (UVA+UVB protection) and wide brim hats (4-inch brim around the entire circumference of the hat). ?- Call for new or changing lesions. ? ?Return in about 6 months (around 10/17/2021) for AK f/u . ? ?IRudell Cobb, CMA, am acting as scribe for Sarina Ser, MD . ?Documentation: I have reviewed the above documentation for accuracy and completeness, and I agree with the above. ? ?Sarina Ser, MD ? ?

## 2021-04-16 NOTE — Patient Instructions (Signed)

## 2021-04-20 ENCOUNTER — Encounter: Payer: Self-pay | Admitting: Dermatology

## 2021-08-23 IMAGING — CT CT CHEST W/ CM
2 of 4 series · 15 of 36 positions shown, 18 images · IV contrast (omnipaque)
Comparison: None.

CLINICAL DATA: Intermittent cough for 3 months. Very remote history
of colon cancer and prostate cancer.

EXAM:
CT CHEST WITH CONTRAST
TECHNIQUE: Multidetector CT imaging of the chest was performed during
intravenous contrast administration.
CONTRAST:  60mL OMNIPAQUE IOHEXOL 300 MG/ML  SOLN

[Series 2: axial chest 2.00 · axial · 0.79mm/px · z∈[-1177,-887]mm · 12 of 173 slices shown, 15 images]
[im 14/173  mediastinal]
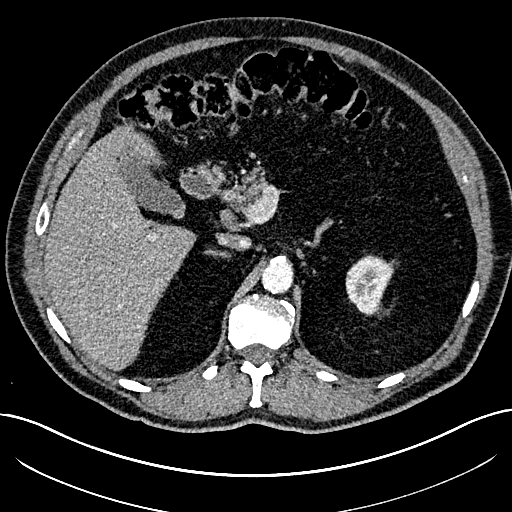
[im 14/173  lung]
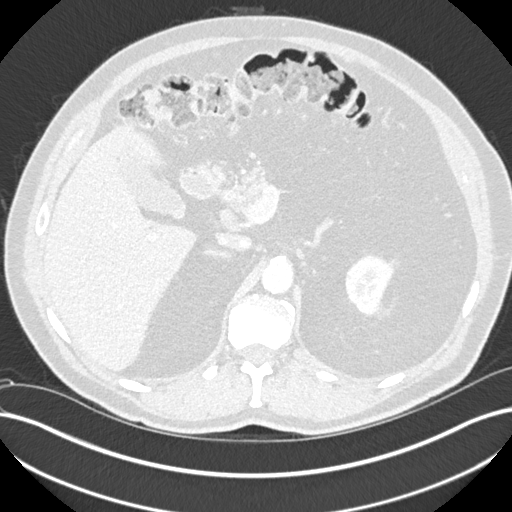
[im 27/173  lung]
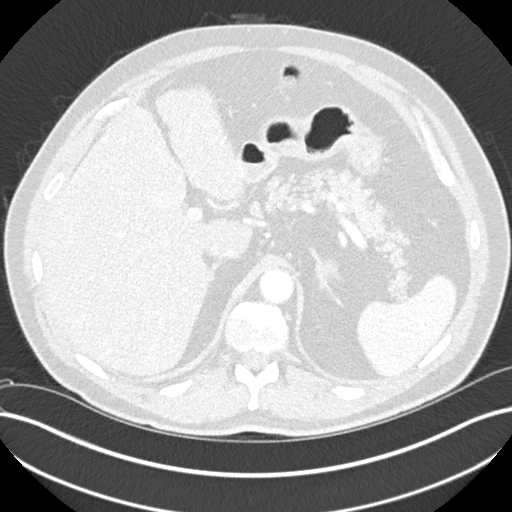
[im 40/173  lung]
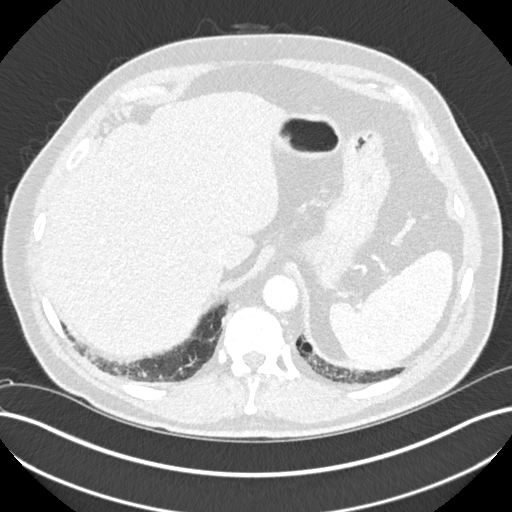
[im 53/173  lung]
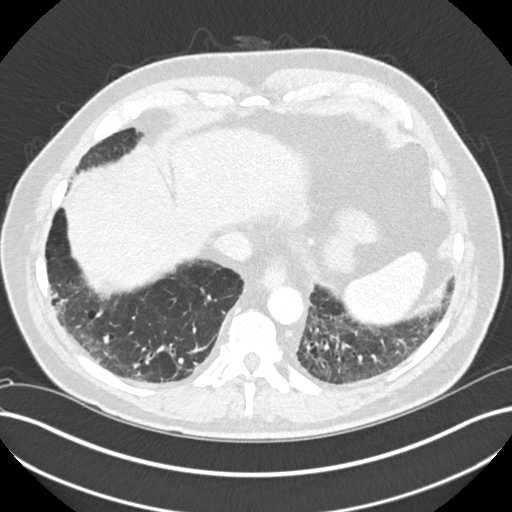
[im 67/173  mediastinal]
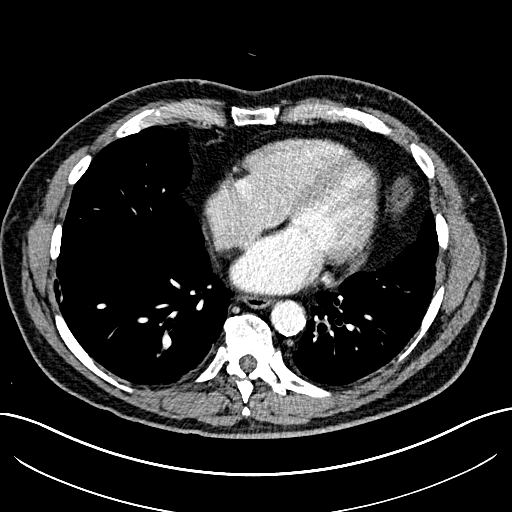
[im 67/173  lung]
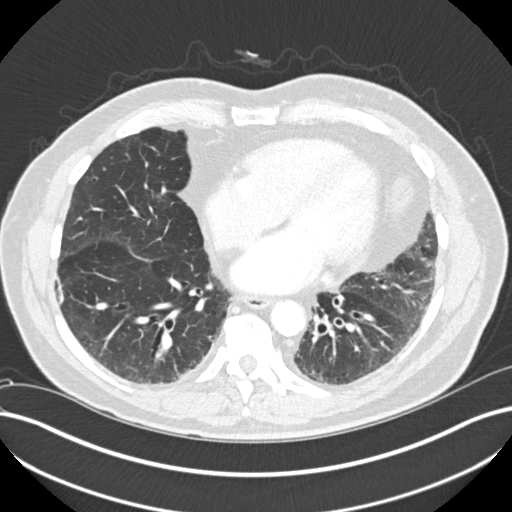
[im 80/173  lung]
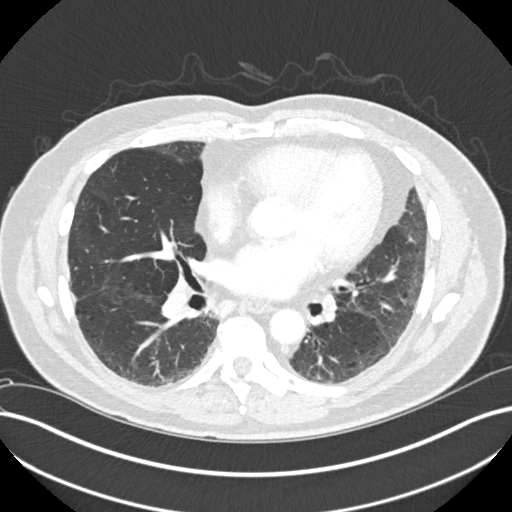
[im 93/173  lung]
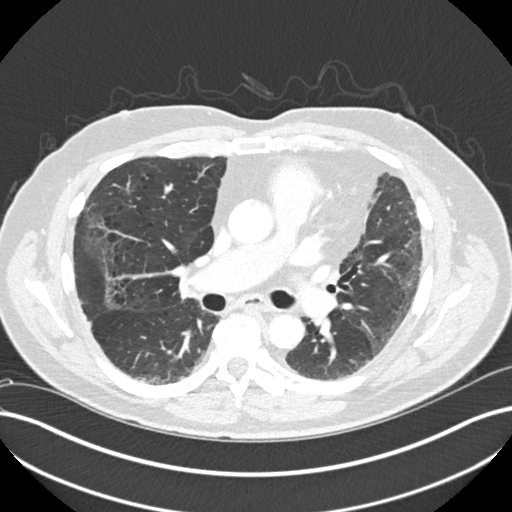
[im 106/173  lung]
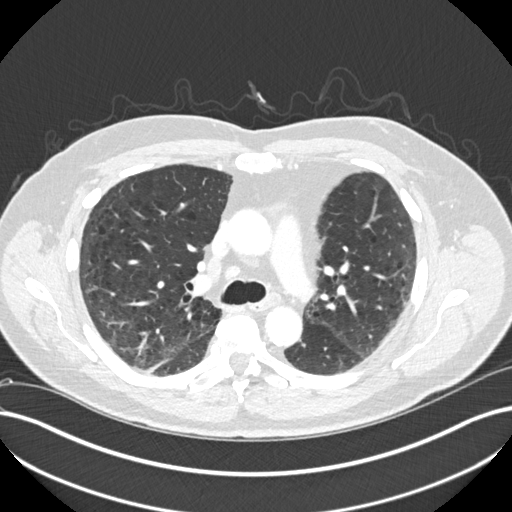
[im 120/173  mediastinal]
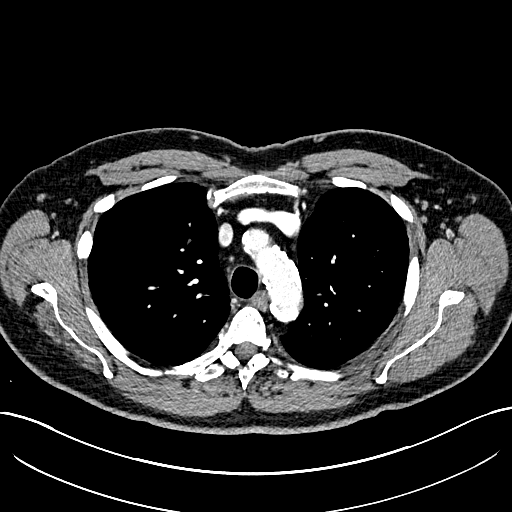
[im 120/173  lung]
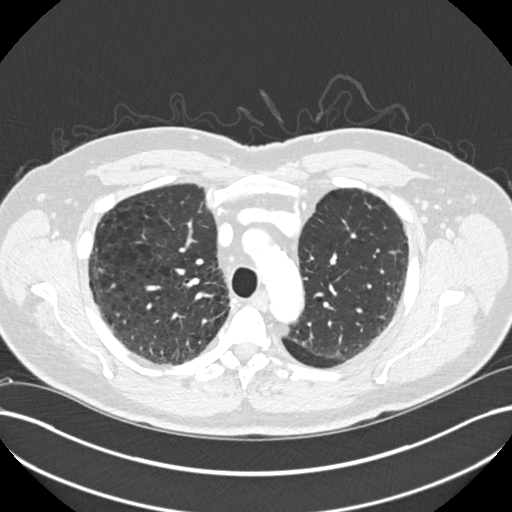
[im 133/173  lung]
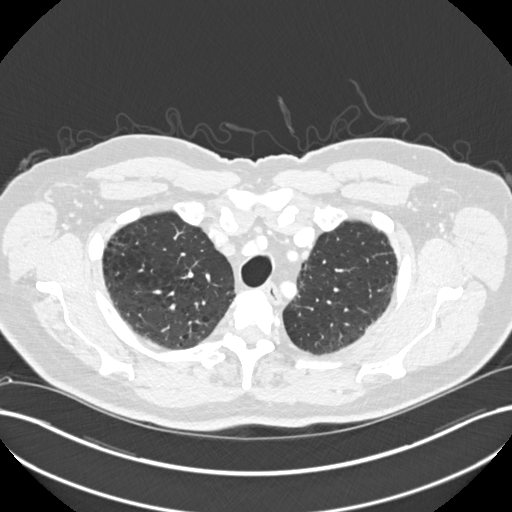
[im 146/173  lung]
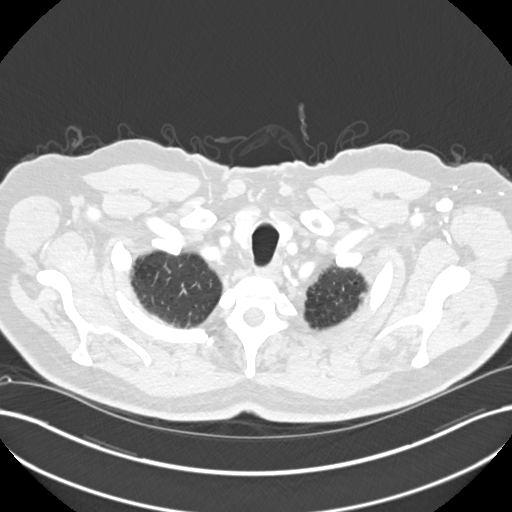
[im 159/173  lung]
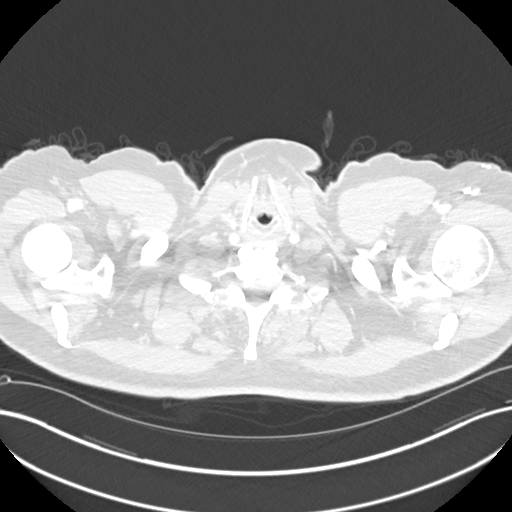

[Series 4: coronal chest 2.00 cor · coronal · 0.68mm/px · 3 of 163 slices shown]
[im 33/163  lung]
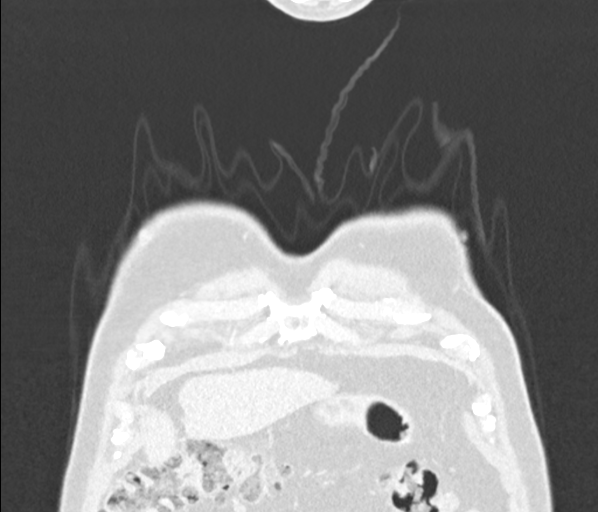
[im 65/163  lung]
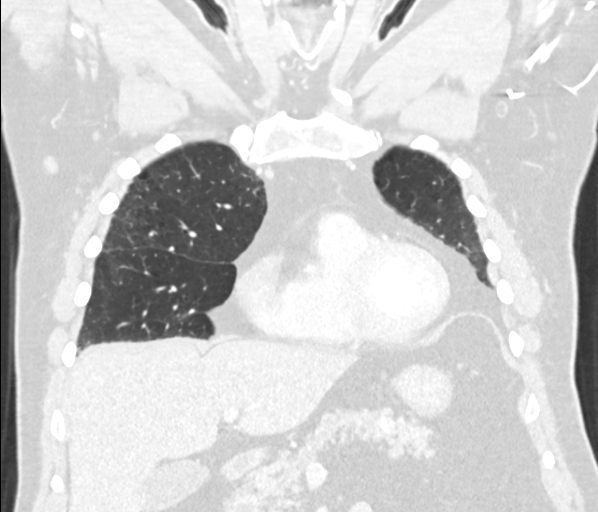
[im 98/163  lung]
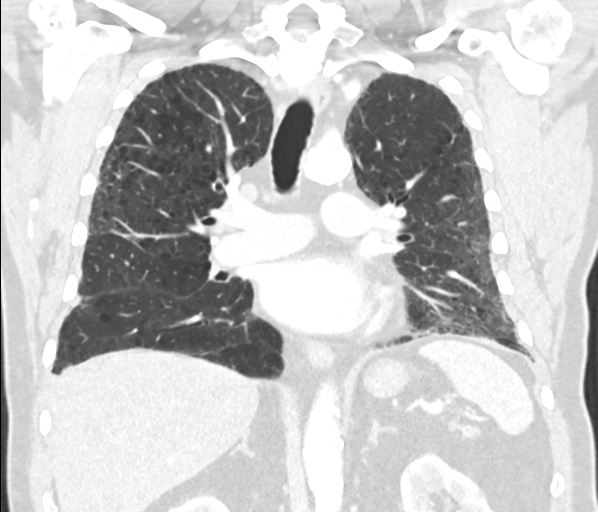

[15 of 36 positions shown; findings below may reference images not displayed]

FINDINGS: Cardiovascular: Normal heart size. No pericardial effusion.
Atheromatous calcification of the aorta and coronaries. Atheromatous
calcification of the great vessels with a proximal right subclavian
atheromatous stenosis measuring approximately 70%. Subjective
high-grade narrowing at the origin of the dominant left vertebral
artery.

Mediastinum/Nodes: Negative for adenopathy or mass.

Lungs/Pleura:

The central airways are clear. Borderline thickening of bronchi in
the lower lobes. Subtle traction bronchiectasis.

Mild centrilobular emphysema.

Subpleural reticulation mainly in the lower lungs where there is
occasional areas of honeycombing including at the medial left base
and along the posterior minor fissure. NSIP and UIP are both
considered.

Upper Abdomen: Atheromatous calcification of the aorta and visible
branch vessels. Transverse colonic diverticulosis.

Musculoskeletal: No acute or aggressive finding.
IMPRESSION: 1. No emergent finding.
2. Mild centrilobular emphysema and airway thickening.
3. Interstitial lung disease with fibrotic features including a few
areas of honeycombing.
4. Atherosclerosis including the coronary arteries. A proximal right
subclavian plaque causes approximately 70% stenosis. There is also a
left vertebral origin stenosis which is likely flow reducing.

Aortic Atherosclerosis (83ZZA-P5S.S) and Emphysema (83ZZA-T30.9).

## 2021-08-26 ENCOUNTER — Emergency Department: Payer: Medicare Other

## 2021-08-26 ENCOUNTER — Emergency Department
Admission: EM | Admit: 2021-08-26 | Discharge: 2021-08-26 | Disposition: A | Discharge status: Home / Self Care | Payer: Medicare Other | Attending: Emergency Medicine | Admitting: Emergency Medicine

## 2021-08-26 DIAGNOSIS — R509 Fever, unspecified: Secondary | ICD-10-CM | POA: Diagnosis not present

## 2021-08-26 DIAGNOSIS — J4541 Moderate persistent asthma with (acute) exacerbation: Secondary | ICD-10-CM | POA: Insufficient documentation

## 2021-08-26 DIAGNOSIS — R0602 Shortness of breath: Secondary | ICD-10-CM

## 2021-08-26 DIAGNOSIS — J449 Chronic obstructive pulmonary disease, unspecified: Secondary | ICD-10-CM | POA: Insufficient documentation

## 2021-08-26 DIAGNOSIS — I10 Essential (primary) hypertension: Secondary | ICD-10-CM | POA: Diagnosis not present

## 2021-08-26 LAB — COMPREHENSIVE METABOLIC PANEL
ALT: 23 U/L (ref 0–44)
AST: 34 U/L (ref 15–41)
Albumin: 4.1 g/dL (ref 3.5–5.0)
Alkaline Phosphatase: 97 U/L (ref 38–126)
Anion gap: 16 — ABNORMAL HIGH (ref 5–15)
BUN: 45 mg/dL — ABNORMAL HIGH (ref 8–23)
CO2: 18 mmol/L — ABNORMAL LOW (ref 22–32)
Calcium: 8.8 mg/dL — ABNORMAL LOW (ref 8.9–10.3)
Chloride: 100 mmol/L (ref 98–111)
Creatinine, Ser: 2.2 mg/dL — ABNORMAL HIGH (ref 0.61–1.24)
GFR, Estimated: 29 mL/min — ABNORMAL LOW (ref >60)
Glucose, Bld: 136 mg/dL — ABNORMAL HIGH (ref 70–99)
Potassium: 3.7 mmol/L (ref 3.5–5.1)
Sodium: 134 mmol/L — ABNORMAL LOW (ref 135–145)
Total Bilirubin: 1.1 mg/dL (ref 0.3–1.2)
Total Protein: 7.5 g/dL (ref 6.5–8.1)

## 2021-08-26 LAB — CBC WITH DIFFERENTIAL/PLATELET
Abs Immature Granulocytes: 0.01 10*3/uL (ref 0.00–0.07)
Basophils Absolute: 0 10*3/uL (ref 0.0–0.1)
Basophils Relative: 0 %
Eosinophils Absolute: 0 10*3/uL (ref 0.0–0.5)
Eosinophils Relative: 0 %
HCT: 41.1 % (ref 39.0–52.0)
Hemoglobin: 13.7 g/dL (ref 13.0–17.0)
Immature Granulocytes: 0 %
Lymphocytes Relative: 14 %
Lymphs Abs: 0.7 10*3/uL (ref 0.7–4.0)
MCH: 31.3 pg (ref 26.0–34.0)
MCHC: 33.3 g/dL (ref 30.0–36.0)
MCV: 93.8 fL (ref 80.0–100.0)
Monocytes Absolute: 0 10*3/uL — ABNORMAL LOW (ref 0.1–1.0)
Monocytes Relative: 0 %
Neutro Abs: 4 10*3/uL (ref 1.7–7.7)
Neutrophils Relative %: 86 %
Platelets: 223 10*3/uL (ref 150–400)
RBC: 4.38 MIL/uL (ref 4.22–5.81)
RDW: 12.1 % (ref 11.5–15.5)
WBC: 4.8 10*3/uL (ref 4.0–10.5)
nRBC: 0 % (ref 0.0–0.2)

## 2021-08-26 MED ORDER — AEROCHAMBER MV MISC: 0 refills | Status: AC

## 2021-08-26 MED ORDER — IPRATROPIUM-ALBUTEROL 0.5-2.5 (3) MG/3ML IN SOLN
3.0000 mL | Freq: Once | RESPIRATORY_TRACT | Status: AC
  Administered 2021-08-26: 3 mL via RESPIRATORY_TRACT
  Filled 2021-08-26: qty 3

## 2021-08-26 MED ORDER — AZITHROMYCIN 250 MG PO TABS: ORAL_TABLET | ORAL | 0 refills | Status: AC

## 2021-08-26 MED ORDER — ALBUTEROL SULFATE HFA 108 (90 BASE) MCG/ACT IN AERS: 2.0000 | INHALATION_SPRAY | Freq: Four times a day (QID) | RESPIRATORY_TRACT | 2 refills | Status: AC | PRN

## 2021-08-26 MED ORDER — PREDNISONE 10 MG (21) PO TBPK: ORAL_TABLET | ORAL | 0 refills | Status: DC

## 2021-08-26 MED ORDER — AEROCHAMBER PLUS FLO-VU MEDIUM MISC
1.0000 | Freq: Once | Status: DC
  Filled 2021-08-26: qty 1

## 2021-08-26 NOTE — ED Triage Notes (Signed)
Pt arrived via EMS from home. Pt has a hx of asthma but is able to recover at home. Pt has had three duo nebs with '125mg'$  of solumedrol and 2g of mag is infusing at this time. Pt is A/Ox4 at this time.

## 2021-08-26 NOTE — ED Provider Notes (Signed)
The  [facility] Provider Note   Event Date/Time   First MD Initiated Contact with Patient 08/26/21 1534     (approximate) History  Respiratory Distress  HPI Scott Branch is a 86 y.o. male with a stated past medical history of hypertension and COPD who presents for worsening shortness of breath over the last 5 days.  Patient states that he has been trying to use his albuterol inhaler at home however he does not have a spacer and states that the puffs of the inhaler are not helping any of his shortness of breath symptoms.  Patient did significantly improve after EMS administered DuoNeb's in route as well as 125 Solu-Medrol and 2 g of magnesium.  Patient does state that he is coughing up yellow/white sputum and endorses subjective fever/chills over the past 2 days.  Patient states the symptoms began to worsen after he was at a funeral around a large group of people on Sunday. ROS: Patient currently denies any vision changes, tinnitus, difficulty speaking, facial droop, sore throat, chest pain, abdominal pain, nausea/vomiting/diarrhea, dysuria, or weakness/numbness/paresthesias in any extremity   Physical Exam  Triage Vital Signs: ED Triage Vitals  Enc Vitals Group     BP 08/26/21 1545 (!) 147/73     Pulse Rate 08/26/21 1545 (!) 138     Resp 08/26/21 1545 (!) 26     Temp 08/26/21 1545 99 F (37.2 C)     Temp Source 08/26/21 1545 Oral     SpO2 08/26/21 1545 92 %     Weight 08/26/21 1535 217 lb (98.4 kg)     Height --      Head Circumference --      Peak Flow --      Pain Score --      Pain Loc --      Pain Edu? --      Excl. in [place]? --    Most recent vital signs: Vitals:   08/26/21 1545 08/26/21 1700  BP: (!) 147/73   Pulse: (!) 138 (!) 122  Resp: (!) 26 20  Temp: 99 F (37.2 C)   SpO2: 92% 99%   General: Awake, oriented x4. CV:  Good peripheral perfusion.  Resp:  Normal effort.  Inspiratory and expiratory wheezing over bilateral lung  fields. Abd:  No distention.  Other:  Elderly Caucasian male sitting in bed in no acute distress. ED Results / Procedures / Treatments  Labs (all labs ordered are listed, but only abnormal results are displayed) Labs Reviewed  COMPREHENSIVE METABOLIC PANEL - Abnormal; Notable for the following components:      Result Value   Sodium 134 (*)    CO2 18 (*)    Glucose, Bld 136 (*)    BUN 45 (*)    Creatinine, Ser 2.20 (*)    Calcium 8.8 (*)    GFR, Estimated 29 (*)    Anion gap 16 (*)    All other components within normal limits  CBC WITH DIFFERENTIAL/PLATELET - Abnormal; Notable for the following components:   Monocytes Absolute 0.0 (*)    All other components within normal limits   EKG ED ECG REPORT I, Naaman Plummer, the attending physician, personally viewed and interpreted this ECG. Date: 08/26/2021 EKG Time: 1541 Rate: 139 Rhythm: Tachycardic sinus rhythm QRS Axis: normal Intervals: normal ST/T Wave abnormalities: normal Narrative Interpretation: Tachycardic sinus rhythm.  No evidence of acute ischemia RADIOLOGY ED MD interpretation: One-view portable chest x-ray interpreted by me shows  no evidence of acute abnormalities including no pneumonia, pneumothorax, or widened mediastinum.  Chronic lung findings without definitive airspace opacity or disease -Agree with radiology assessment Official radiology report(s): DG Chest Port 1 View  Result Date: 08/26/2021 CLINICAL DATA:  Shortness of breath EXAM: PORTABLE CHEST 1 VIEW COMPARISON:  Chest CT dated March 06, 2020 FINDINGS: Prominence of the cardiac contours, likely due to prominent pericardial when compared with prior CT. Background emphysema. Reticular opacities of the right upper lobe and bilateral lower lobes, likely due to fibrotic interstitial lung disease when compared with prior chest CT. No focal consolidation. No large pleural effusion or pneumothorax. IMPRESSION: Chronic lung findings with no definite acute airspace  opacity. Electronically Signed   By: Yetta Glassman M.D.   On: 08/26/2021 16:23   PROCEDURES: Critical Care performed: No .1-3 Lead EKG Interpretation  Performed by: Naaman Plummer, MD Authorized by: Naaman Plummer, MD     Interpretation: abnormal     ECG rate:  123   ECG rate assessment: tachycardic     Rhythm: sinus tachycardia     Ectopy: none     Conduction: normal    MEDICATIONS ORDERED IN ED: Medications  AeroChamber Plus Flo-Vu Medium MISC 1 each (0 each Other Hold 08/26/21 1643)  ipratropium-albuterol (DUONEB) 0.5-2.5 (3) MG/3ML nebulizer solution 3 mL (3 mLs Nebulization Given 08/26/21 1625)   IMPRESSION / MDM / ASSESSMENT AND PLAN / ED COURSE  I reviewed the triage vital signs and the nursing notes.                             The patient is on the cardiac monitor to evaluate for evidence of arrhythmia and/or significant heart rate changes. Patient's presentation is most consistent with acute presentation with potential threat to life or bodily function. Presentation most consistent with acute asthma exacerbation.  Presentation less concerning for pneumonia, heart failure, foreign body airway obstruction, pulmonary embolism, tamponade, atypical ACS  Reassuring factors: No AMS, silent respirations, belly-breathing, or other sign of impending ventilatory failure. Never intubated or admitted to the hospital for asthma exacerbation.  Workup Defer labs and imaging given clinically in exacerbation of known asthma with similar exacerbation presentations per patient.  Reassessment: Patient respiratory status improved with breathing treatment.  Disposition: Discharge home with return precautions. Advised to follow up with primary care physician within next 24-48 hours.   FINAL CLINICAL IMPRESSION(S) / ED DIAGNOSES   Final diagnoses:  Shortness of breath  Moderate persistent asthma with exacerbation   Rx / DC Orders   ED Discharge Orders          Ordered     Spacer/Aero-Holding Chambers (AEROCHAMBER MV) inhaler        08/26/21 1703    albuterol (VENTOLIN HFA) 108 (90 Base) MCG/ACT inhaler  Every 6 hours PRN        08/26/21 1703    predniSONE (STERAPRED UNI-PAK 21 TAB) 10 MG (21) TBPK tablet        08/26/21 1703    azithromycin (ZITHROMAX Z-PAK) 250 MG tablet        08/26/21 1703           Note:  This document was prepared using Dragon voice recognition software and may include unintentional dictation errors.   Naaman Plummer, MD 08/26/21 [phone]

## 2021-09-23 ENCOUNTER — Encounter: Payer: Self-pay | Admitting: Internal Medicine

## 2021-09-23 ENCOUNTER — Ambulatory Visit: Payer: Medicare Other | Admitting: Internal Medicine

## 2021-09-23 VITALS — BP 124/78 | HR 80 | Temp 97.9°F | Ht 70.0 in | Wt 209.0 lb

## 2021-09-23 DIAGNOSIS — J84112 Idiopathic pulmonary fibrosis: Secondary | ICD-10-CM

## 2021-09-23 DIAGNOSIS — J449 Chronic obstructive pulmonary disease, unspecified: Secondary | ICD-10-CM | POA: Diagnosis not present

## 2021-09-23 DIAGNOSIS — J4489 Other specified chronic obstructive pulmonary disease: Secondary | ICD-10-CM

## 2021-09-23 NOTE — Progress Notes (Signed)
SHADMAN TOZZI    [id number]    1935/04/23  Primary Care Physician:Anderson, Ocie Cornfield, MD  Referring Physician: Kirk Ruths, MD [facility] [place],  [place] [id number] Reason for Consultation: shortness of breath Date of Consultation: 09/23/2021  Chief complaint:   Chief Complaint  Patient presents with   Consult    He feels short of breath with exertion.      HPI: Scott Branch is a 86 y.o. man who presents for new patient evaluation for shortness of breath.   He had sudden onset of dyspnea about a month ago. He had several days of dyspnea and EMS had to be called. Improved with steroids, bronchodilators. He is now feeling better. Gets flares for his COPD about twice a year.   He does have a history of childhood asthma. Has been very active most of his life.  Takes stiolto 2 puffs once a day.   He has seen pulmonary at the fellows clinic at [facility]. Would like to establish here.  He takes albuterol prn about twice a day.   He continues to play golf and is very active. Denies limitations   He had pulmonary function testing done with Dr. Casper Harrison his previous pulmonologist who retired. (Over 15 years ago.) Had a CT chest done in Feb 2022 which shows emphysema and possible bibasilar fibrosis. This was done for a cough which has since resolved.    Social history:  Occupation: retired, used to work for [employer]: lives at home with wife and dog.  Smoking history: 2 ppd x 20 = 40 pack years.   Social History   Occupational History   Not on file  Tobacco Use   Smoking status: Former    Types: Cigarettes    Quit date: 08/19/1980    Years since quitting: 41.1   Smokeless tobacco: Never  Vaping Use   Vaping Use: Never used  Substance and Sexual Activity   Alcohol use: Yes    Alcohol/week: 1.0 standard drink of alcohol    Types: 1 Cans of beer per week    Comment: OCCAS   Drug use:  Never   Sexual activity: Yes    Relevant family history:  Family History  Problem Relation Age of Onset   Lung disease Neg Hx    Lung cancer Neg Hx     Past Medical History:  Diagnosis Date   Arthritis    Asthma    Chronic kidney disease    STAGE 3   Colon cancer ([place]) 1982   Partial Colon resection   COPD (chronic obstructive pulmonary disease) ([place])    CHRONIC BRONCHITIS   Cough    CHRONIC BRONCHITIS   Hypertension    Prostate cancer ([place]) 2000   Prostatectomy    Past Surgical History:  Procedure Laterality Date   BLEPHAROPLASTY Bilateral 2020   COLON SURGERY     EYE SURGERY     Bilateral cataract surgery.   HERNIA REPAIR     X 2 INGUINAL   KNEE ARTHROSCOPY Left 08/27/2017   Procedure: ARTHROSCOPY KNEE  PARTIAL MEDIAL MENISECTOMY CHONDROPLASTY;  Surgeon: Dereck Leep, MD;  Location: [facility] ORS;  Service: Orthopedics;  Laterality: Left;   KNEE ARTHROSCOPY Right 08/28/2019   Procedure: ARTHROSCOPY KNEE;  Surgeon: Dereck Leep, MD;  Location: [facility] ORS;  Service: Orthopedics;  Laterality: Right;   PROSTATECTOMY     SHOULDER ARTHROSCOPY WITH  OPEN ROTATOR CUFF REPAIR Right 01/13/2018   Procedure: SHOULDER ARTHROSCOPY WITH OPEN ROTATOR CUFF REPAIR;  Surgeon: Corky Mull, MD;  Location: [facility] ORS;  Service: Orthopedics;  Laterality: Right;     Physical Exam: Blood pressure 124/78, pulse 80, temperature 97.9 F (36.6 C), height '5\' 10"'$  (1.778 m), weight 209 lb (94.8 kg), SpO2 97 %. Gen:      No acute distress ENT:  no nasal polyps, mucus membranes moist Lungs:    No increased respiratory effort, symmetric chest wall excursion, clear to auscultation bilaterally, bibasilar crackles CV:         Regular rate and rhythm; no murmurs, rubs, or gallops.  No pedal edema Abd:      + bowel sounds; soft, non-tender; no distension MSK: no acute synovitis of DIP or PIP joints, no mechanics hands.  Skin:      Warm and dry; no rashes Neuro: normal speech, no focal facial  asymmetry Psych: alert and oriented x3, normal mood and affect   Data Reviewed/Medical Decision Making:  Independent interpretation of tests: Imaging:  Review of patient's CT Chest Feb 2022 images revealed moderate centrilobular emphysema with mild bibasilar fibrosis. The patient's images have been independently reviewed by me.    PFTs:  Labs:   Immunization status:  Immunization History  Administered Date(s) Administered   Influenza Inj Mdck Quad Pf 10/14/2018   Influenza, Seasonal, Injecte, Preservative Fre 12/16/2004, 11/24/2009, 10/26/2011   Influenza,inj,Quad PF,6+ Mos 10/30/2013, 11/06/2014, 10/22/2015, 10/04/2019   Influenza-Unspecified 12/07/2012, 10/30/2013, 11/06/2014, 10/22/2015, 10/16/2016, 10/29/2017, 11/08/2020   Moderna Sars-Covid-2 Vaccination 02/07/2019, 03/07/2019   Pneumococcal Conjugate-13 10/30/2013   Zoster, Live 06/22/2012     I reviewed prior external note(s) from [facility] pulmonary, ED  I reviewed the result(s) of the labs and imaging as noted above.   I have ordered PFT   Assessment:  COPD with emphysema Mild pulmonary fibrosis, bibasilar History of tobacco use disorder, outside window for lung cancer screening  Plan/Recommendations:  Continue your COPD medications with stiolto 2 puffs twice a day and albuterol as needed.  Call me if you need any refills.  Call me if you feel like you are getting sick with bronchitis. We are trying to avoid you having to go to the hospital.  You do have some mild fibrosis or scarring in the lungs. It is mild and I think we can simply monitor for now.  We will get breathing testing at next appointment.  We discussed disease management and progression at length today.    Return to Care: Return in about 6 months (around 03/26/2022).  Lenice Llamas, MD Pulmonary and [place]  CC: Kirk Ruths, MD

## 2021-09-23 NOTE — Patient Instructions (Addendum)
Please schedule follow up scheduled with myself in 6 months.  If my schedule is not open yet, we will contact you with a reminder closer to that time. Please call [phone] if you haven't heard from [place] a month before.   Before your next visit I would like you to have: Full set of PFTs -1  hour. Come early at next appointment for this.   Continue your COPD medications with stiolto 2 puffs twice a day and albuterol as needed.  Call me if you need any refills.  Call me if you feel like you are getting sick with bronchitis. We are trying to avoid you having to go to the hospital.  You do have some mild fibrosis or scarring in the lungs. It is mild and I think we can simply monitor for now.  We will get breathing testing at next appointment.  Understanding COPD   What is COPD? COPD stands for chronic obstructive pulmonary (lung) disease. COPD is a general term used for several lung diseases.  COPD is an umbrella term and encompasses other  common diseases in this group like chronic bronchitis and emphysema. Chronic asthma may also be included in this group. While some patients with COPD have only chronic bronchitis or emphysema, most patients have a combination of both.  You might hear these terms used in exchange for one another.   COPD adds to the work of the heart. Diseased lungs may reduce the amount of oxygen that goes to the blood. High blood pressure in blood vessels from the heart to the lungs makes it difficult for the heart to pump. Lung disease can also cause the body to produce too many red blood cells which may make the blood thicker and harder to pump.   Patients who have COPD with low oxygen levels may develop an enlarged heart (cor pulmonale). This condition weakens the heart and causes increased shortness of breath and swelling in the legs and feet.   Chronic bronchitis Chronic bronchitis is irritation and inflammation (swelling) of the lining in the bronchial tubes (air  passages). The irritation causes coughing and an excess amount of mucus in the airways. The swelling makes it difficult to get air in and out of the lungs. The small, hair-like structures on the inside of the airways (called cilia) may be damaged by the irritation. The cilia are then unable to help clean mucus from the airways.  Bronchitis is generally considered to be chronic when you have: a productive cough (cough up mucus) and shortness of breath that lasts about 3 months or more each year for 2 or more years in a row. Your doctor may define chronic bronchitis differently.   Emphysema Emphysema is the destruction, or breakdown, of the walls of the alveoli (air sacs) located at the end of the bronchial tubes. The damaged alveoli are not able to exchange oxygen and carbon dioxide between the lungs and the blood. The bronchioles lose their elasticity and collapse when you exhale, trapping air in the lungs. The trapped air keeps fresh air and oxygen from entering the lungs.   Who is affected by COPD? Emphysema and chronic bronchitis affect approximately 16 million people in the [place], or close to 11 percent of the population.   Symptoms of COPD  Shortness of breath  Shortness of breath with mild exercise (walking, using the stairs, etc.)  Chronic, productive cough (with mucus)  A feeling of "tightness" in the chest  Wheezing   What causes  COPD? The two primary causes of COPD are cigarette smoking and alpha1-antitrypsin (AAT) deficiency. Air pollution and occupational dusts may also contribute to COPD, especially when the person exposed to these substances is a cigarette smoker.  Cigarette smoke causes COPD by irritating the airways and creating inflammation that narrows the airways, making it more difficult to breathe. Cigarette smoke also causes the cilia to stop working properly so mucus and trapped particles are not cleaned from the airways. As a result, chronic cough and excess mucus  production develop, leading to chronic bronchitis.  In some people, chronic bronchitis and infections can lead to destruction of the small airways, or emphysema.  AAT deficiency, an inherited disorder, can also lead to emphysema. Alpha antitrypsin (AAT) is a protective material produced in the liver and transported to the lungs to help combat inflammation. When there is not enough of the chemical AAT, the body is no longer protected from an enzyme in the white blood cells.   How is COPD diagnosed?  To diagnose COPD, the physician needs to know: Do you smoke?  Have you had chronic exposure to dust or air pollutants?  Do other members of your family have lung disease?  Are you short of breath?  Do you get short of breath with exercise?  Do you have chronic cough and/or wheezing?  Do you cough up excess mucus?  To help with the diagnosis, the physician will conduct a thorough physical exam which includes:  Listening to your lungs and heart  Checking your blood pressure and pulse  Examining your nose and throat  Checking your feet and ankles for swelling   Laboratory and other tests Several laboratory and other tests are needed to confirm a diagnosis of COPD. These tests may include:  Chest X-ray to look for lung changes that could be caused by COPD   Spirometry and pulmonary function tests (PFTs) to determine lung volume and air flow  Pulse oximetry to measure the saturation of oxygen in the blood  Arterial blood gases (ABGs) to determine the amount of oxygen and carbon dioxide in the blood  Exercise testing to determine if the oxygen level in the blood drops during exercise   Treatment In the beginning stages of COPD, there is minimal shortness of breath that may be noticed only during exercise. As the disease progresses, shortness of breath may worsen and you may need to wear an oxygen device.   To help control other symptoms of COPD, the following treatments and lifestyle changes may be  prescribed.  Quitting smoking  Avoiding cigarette smoke and other irritants  Taking medications including: a. bronchodilators b. anti-inflammatory agents c. oxygen d. antibiotics  Maintaining a healthy diet  Following a structured exercise program such as pulmonary rehabilitation Preventing respiratory infections  Controlling stress   If your COPD progresses, you may be eligible to be evaluated for lung volume reduction surgery or lung transplantation. You may also be eligible to participate in certain clinical trials (research studies). Ask your health care providers about studies being conducted in your hospital.   What is the outlook? Although COPD can not be cured, its symptoms can be treated and your quality of life can be improved. Your prognosis or outlook for the future will depend on how well your lungs are functioning, your symptoms, and how well you respond to and follow your treatment plan.

## 2021-10-22 ENCOUNTER — Encounter: Payer: Self-pay | Admitting: Dermatology

## 2021-10-22 ENCOUNTER — Ambulatory Visit: Payer: Medicare Other | Admitting: Dermatology

## 2021-10-22 DIAGNOSIS — D692 Other nonthrombocytopenic purpura: Secondary | ICD-10-CM | POA: Diagnosis not present

## 2021-10-22 DIAGNOSIS — L570 Actinic keratosis: Secondary | ICD-10-CM

## 2021-10-22 DIAGNOSIS — L820 Inflamed seborrheic keratosis: Secondary | ICD-10-CM | POA: Diagnosis not present

## 2021-10-22 DIAGNOSIS — L578 Other skin changes due to chronic exposure to nonionizing radiation: Secondary | ICD-10-CM

## 2021-10-22 NOTE — Progress Notes (Signed)
   Follow-Up Visit   Subjective  Scott Branch is a 85 y.o. male who presents for the following: Actinic Keratosis (6 month follow up of face, ears). The patient has spots, moles and lesions to be evaluated, some may be new or changing and the patient has concerns that these could be cancer.  The following portions of the chart were reviewed this encounter and updated as appropriate:   Tobacco  Allergies  Meds  Problems  Med Hx  Surg Hx  Fam Hx     Review of Systems:  No other skin or systemic complaints except as noted in HPI or Assessment and Plan.  Objective  Well appearing patient in no apparent distress; mood and affect are within normal limits.  A focused examination was performed including scalp, face, arms. Relevant physical exam findings are noted in the Assessment and Plan.  Face/ears/scalp/neck (15) Erythematous thin papules/macules with gritty scale.   Left preauricular Erythematous stuck-on, waxy papule or plaque   Assessment & Plan   Actinic Damage - chronic, secondary to cumulative UV radiation exposure/sun exposure over time - diffuse scaly erythematous macules with underlying dyspigmentation - Recommend daily broad spectrum sunscreen SPF 30+ to sun-exposed areas, reapply every 2 hours as needed.  - Recommend staying in the shade or wearing long sleeves, sun glasses (UVA+UVB protection) and wide brim hats (4-inch brim around the entire circumference of the hat). - Call for new or changing lesions.  Purpura - Chronic; persistent and recurrent.  Treatable, but not curable. - Violaceous macules and patches - Benign - Related to trauma, age, sun damage and/or use of blood thinners, chronic use of topical and/or oral steroids - Observe - Can use OTC arnica containing moisturizer such as Dermend Bruise Formula if desired - Call for worsening or other concerns  AK (actinic keratosis) (15) Face/ears/scalp/neck  Destruction of lesion -  Face/ears/scalp/neck Complexity: simple   Destruction method: cryotherapy   Informed consent: discussed and consent obtained   Timeout:  patient name, date of birth, surgical site, and procedure verified Lesion destroyed using liquid nitrogen: Yes   Region frozen until ice ball extended beyond lesion: Yes   Outcome: patient tolerated procedure well with no complications   Post-procedure details: wound care instructions given    Inflamed seborrheic keratosis Left preauricular  Destruction of lesion - Left preauricular Complexity: simple   Destruction method: cryotherapy   Informed consent: discussed and consent obtained   Timeout:  patient name, date of birth, surgical site, and procedure verified Lesion destroyed using liquid nitrogen: Yes   Region frozen until ice ball extended beyond lesion: Yes   Outcome: patient tolerated procedure well with no complications   Post-procedure details: wound care instructions given     Return in about 6 months (around 04/22/2022) for AK follow up.  I, Ashok Cordia, CMA, am acting as scribe for Sarina Ser, MD . Documentation: I have reviewed the above documentation for accuracy and completeness, and I agree with the above.  Sarina Ser, MD

## 2021-10-22 NOTE — Patient Instructions (Addendum)
Cryotherapy Aftercare  Wash gently with soap and water everyday.   Apply Vaseline and Band-Aid daily until healed.     Due to recent changes in healthcare laws, you may see results of your pathology and/or laboratory studies on MyChart before the doctors have had a chance to review them. We understand that in some cases there may be results that are confusing or concerning to you. Please understand that not all results are received at the same time and often the doctors may need to interpret multiple results in order to provide you with the best plan of care or course of treatment. Therefore, we ask that you please give us 2 business days to thoroughly review all your results before contacting the office for clarification. Should we see a critical lab result, you will be contacted sooner.   If You Need Anything After Your Visit  If you have any questions or concerns for your doctor, please call our main line at [phone] and press option 4 to reach your doctor's medical assistant. If no one answers, please leave a voicemail as directed and we will return your call as soon as possible. Messages left after 4 pm will be answered the following business day.   You may also send us a message via MyChart. We typically respond to MyChart messages within 1-2 business days.  For prescription refills, please ask your pharmacy to contact our office. Our fax number is [phone].  If you have an urgent issue when the clinic is closed that cannot wait until the next business day, you can page your doctor at the number below.    Please note that while we do our best to be available for urgent issues outside of office hours, we are not available 24/7.   If you have an urgent issue and are unable to reach us, you may choose to seek medical care at your doctor's office, retail clinic, urgent care center, or emergency room.  If you have a medical emergency, please immediately call 911 or go to the  emergency department.  Pager Numbers  - Dr. Kowalski: [phone]  - Dr. Moye: [phone]  - Dr. Stewart: [phone]  In the event of inclement weather, please call our main line at [phone] for an update on the status of any delays or closures.  Dermatology Medication Tips: Please keep the boxes that topical medications come in in order to help keep track of the instructions about where and how to use these. Pharmacies typically print the medication instructions only on the boxes and not directly on the medication tubes.   If your medication is too expensive, please contact our office at [phone] option 4 or send us a message through MyChart.   We are unable to tell what your co-pay for medications will be in advance as this is different depending on your insurance coverage. However, we may be able to find a substitute medication at lower cost or fill out paperwork to get insurance to cover a needed medication.   If a prior authorization is required to get your medication covered by your insurance company, please allow us 1-2 business days to complete this process.  Drug prices often vary depending on where the prescription is filled and some pharmacies may offer cheaper prices.  The website www.goodrx.com contains coupons for medications through different pharmacies. The prices here do not account for what the cost may be with help from insurance (it may be cheaper with your insurance), but the website can   give you the price if you did not use any insurance.  - You can print the associated coupon and take it with your prescription to the pharmacy.  - You may also stop by our office during regular business hours and pick up a GoodRx coupon card.  - If you need your prescription sent electronically to a different pharmacy, notify our office through [facility] MyChart or by phone at [phone] option 4.     Si Usted Necesita Algo Despus de Su Visita  Tambin puede  enviarnos un mensaje a travs de MyChart. Por lo general respondemos a los mensajes de MyChart en el transcurso de 1 a 2 das hbiles.  Para renovar recetas, por favor pida a su farmacia que se ponga en contacto con nuestra oficina. Nuestro nmero de fax es el [phone].  Si tiene un asunto urgente cuando la clnica est cerrada y que no puede esperar hasta el siguiente da hbil, puede llamar/localizar a su doctor(a) al nmero que aparece a continuacin.   Por favor, tenga en cuenta que aunque hacemos todo lo posible para estar disponibles para asuntos urgentes fuera del horario de oficina, no estamos disponibles las 24 horas del da, los 7 das de la semana.   Si tiene un problema urgente y no puede comunicarse con nosotros, puede optar por buscar atencin mdica  en el consultorio de su doctor(a), en una clnica privada, en un centro de atencin urgente o en una sala de emergencias.  Si tiene una emergencia mdica, por favor llame inmediatamente al 911 o vaya a la sala de emergencias.  Nmeros de bper  - Dr. Kowalski: [phone]  - Dra. Moye: [phone]  - Dra. Stewart: [phone]  En caso de inclemencias del tiempo, por favor llame a nuestra lnea principal al [phone] para una actualizacin sobre el estado de cualquier retraso o cierre.  Consejos para la medicacin en dermatologa: Por favor, guarde las cajas en las que vienen los medicamentos de uso tpico para ayudarle a seguir las instrucciones sobre dnde y cmo usarlos. Las farmacias generalmente imprimen las instrucciones del medicamento slo en las cajas y no directamente en los tubos del medicamento.   Si su medicamento es muy caro, por favor, pngase en contacto con nuestra oficina llamando al [phone] y presione la opcin 4 o envenos un mensaje a travs de MyChart.   No podemos decirle cul ser su copago por los medicamentos por adelantado ya que esto es diferente dependiendo de la cobertura de su seguro.  Sin embargo, es posible que podamos encontrar un medicamento sustituto a menor costo o llenar un formulario para que el seguro cubra el medicamento que se considera necesario.   Si se requiere una autorizacin previa para que su compaa de seguros cubra su medicamento, por favor permtanos de 1 a 2 das hbiles para completar este proceso.  Los precios de los medicamentos varan con frecuencia dependiendo del lugar de dnde se surte la receta y alguna farmacias pueden ofrecer precios ms baratos.  El sitio web www.goodrx.com tiene cupones para medicamentos de diferentes farmacias. Los precios aqu no tienen en cuenta lo que podra costar con la ayuda del seguro (puede ser ms barato con su seguro), pero el sitio web puede darle el precio si no utiliz ningn seguro.  - Puede imprimir el cupn correspondiente y llevarlo con su receta a la farmacia.  - Tambin puede pasar por nuestra oficina durante el horario de atencin regular y recoger una tarjeta de cupones de GoodRx.  -   Si necesita que su receta se enve electrnicamente a una farmacia diferente, informe a nuestra oficina a travs de MyChart de [place] o por telfono llamando al [phone] y presione la opcin 4.  

## 2022-03-25 ENCOUNTER — Ambulatory Visit: Payer: Medicare Other | Admitting: Nurse Practitioner

## 2022-03-25 ENCOUNTER — Ambulatory Visit (INDEPENDENT_AMBULATORY_CARE_PROVIDER_SITE_OTHER): Payer: Medicare Other

## 2022-03-25 ENCOUNTER — Encounter: Payer: Self-pay | Admitting: Nurse Practitioner

## 2022-03-25 ENCOUNTER — Ambulatory Visit: Payer: Medicare Other | Admitting: Internal Medicine

## 2022-03-25 VITALS — BP 110/64 | HR 84 | Ht 70.0 in | Wt 213.4 lb

## 2022-03-25 DIAGNOSIS — J4489 Other specified chronic obstructive pulmonary disease: Secondary | ICD-10-CM

## 2022-03-25 DIAGNOSIS — J84112 Idiopathic pulmonary fibrosis: Secondary | ICD-10-CM | POA: Diagnosis not present

## 2022-03-25 DIAGNOSIS — J439 Emphysema, unspecified: Secondary | ICD-10-CM | POA: Diagnosis not present

## 2022-03-25 DIAGNOSIS — J441 Chronic obstructive pulmonary disease with (acute) exacerbation: Secondary | ICD-10-CM | POA: Diagnosis not present

## 2022-03-25 MED ORDER — DOXYCYCLINE HYCLATE 100 MG PO TABS: 100.0000 mg | ORAL_TABLET | Freq: Two times a day (BID) | ORAL | 0 refills | Status: AC

## 2022-03-25 MED ORDER — PREDNISONE 20 MG PO TABS: 40.0000 mg | ORAL_TABLET | Freq: Every day | ORAL | 0 refills | Status: AC

## 2022-03-25 NOTE — Progress Notes (Signed)
Full PFT performed today. °

## 2022-03-25 NOTE — Assessment & Plan Note (Signed)
Previous diagnosis of COPD, maintain on LAMA/LABA therapy. He had pulmonary function testing completed today without formal obstruction; however, he did use his inhaler 3 hours prior to testing so possible this skewed the results. He does have emphysema and has a moderate diffusion defect, which is likely related to this as well as his underlying IPF. Appears to be having a mild exacerbation today with increased productive cough and dyspnea. We will treat him with prednisone burst and empiric doxycycline. CXR to rule out superimposed infection.  He has received good benefit from use of Stiolto; we will continue him on this. Action plan in place.   Patient Instructions  Continue Albuterol inhaler 2 puffs every 6 hours as needed for shortness of breath or wheezing. Notify if symptoms persist despite rescue inhaler/neb use.  Continue Stiolto 2 puffs daily  Prednisone 40 mg daily for 5 days. Take in AM with food.  Doxycycline 1 tab Twice daily for 7 days. Take with food. Wear sunscreen when outside as this increases your risk for sunburns Guaifenesin [phone] mg over the counter Twice daily for chest congestion   Your pulmonary function testing and previous imaging are concerning for underlying pulmonary fibrosis. After our discussion today, we will refrain from further workup for now and continue to monitor your symptoms  Follow up in 2 weeks with Dr. Shearon Stalls or Joellen Jersey Kia Varnadore,NP to make sure you are feeling better. If symptoms do not improve or worsen, please contact office for sooner follow up or seek emergency care.

## 2022-03-25 NOTE — Patient Instructions (Signed)
Full PFT performed today. °

## 2022-03-25 NOTE — Assessment & Plan Note (Signed)
Mild pulmonary fibrosis on previous CT imaging from 2022. Will request most recent CT chest from his PCP at [facility]. His PFTs today showed a restrictive pattern, consistent with underlying ILD. He also has crackles at the lung bases. Disease prognosis and progression discussed at length. Not interested in antifibrotic therapy or additional workup. He would like to hold off on further imaging and shared decision to more forward with watchful waiting.

## 2022-03-25 NOTE — Patient Instructions (Signed)
Continue Albuterol inhaler 2 puffs every 6 hours as needed for shortness of breath or wheezing. Notify if symptoms persist despite rescue inhaler/neb use.  Continue Stiolto 2 puffs daily  Prednisone 40 mg daily for 5 days. Take in AM with food.  Doxycycline 1 tab Twice daily for 7 days. Take with food. Wear sunscreen when outside as this increases your risk for sunburns Guaifenesin [phone] mg over the counter Twice daily for chest congestion   Your pulmonary function testing and previous imaging are concerning for underlying pulmonary fibrosis. After our discussion today, we will refrain from further workup for now and continue to monitor your symptoms  Follow up in 2 weeks with Scott Branch or Scott Jersey Katisha Shimizu,NP to make sure you are feeling better. If symptoms do not improve or worsen, please contact office for sooner follow up or seek emergency care.

## 2022-03-25 NOTE — Progress Notes (Unsigned)
$'@Patient'Q$  ID: Scott Branch, male    DOB: 1935/11/24, 87 y.o.   MRN: [id number]  Chief Complaint  Patient presents with   Follow-up    Pt f/u after PFT he state that he is feeling well. No questions or concerns other than feeling some congested starting (2-3 weeks prior)    Referring provider: Kirk Ruths, MD  HPI: 87 year old male, former smoker followed for COPD with chronic bronchitis and emphysema and IPF. He is a patient of Dr. Mauricio Po and last seen in office 09/23/2021. Past medical history significant for HTN, CKD, HLD.   TEST/EVENTS:  03/06/2020 CT chest w con: ahterosclerosis. Mild centrilobular emphysema. Borderline thickening of bronchi in the lower lobes. Subtle traction btx. Subpleural reticulation mainly in the lower lungs with occasional areas of honeycombing. NSIP and UIP both considered.   03/25/2022: Today - follow up Patient presents today with his wife for follow up after PFTs which showed a moderate restrictive defect and diffusion defect. No formal obstruction. He tells me today that he has been dealing with some increased chest congestion and cough over the past 3 weeks. Getting up yellow phlegm. He does feel slightly more short of breath with certain activities, has been using his rescue inhaler a bit more. He denies any fevers, chills, hemoptysis, leg swelling, wheezing. Prior to this, he was doing quite well and felt like he could complete all activities without difficulty, including golfing on a regular basis. He feels like he receives benefit from the [employer] inhaler.  He tells me he had a CT this past year with his PCP at [facility] clinic. He was told it looked good. He does not want to undergo further workup regarding the pulmonary fibrosis findings. Tells me that he is 87 and happy with where he is at. He would not want to add on more medications either, unless it's short term.   No Known Allergies  Immunization History  Administered Date(s)  Administered   Influenza Inj Mdck Quad Pf 10/14/2018   Influenza, Seasonal, Injecte, Preservative Fre 12/16/2004, 11/24/2009, 10/26/2011   Influenza,inj,Quad PF,6+ Mos 10/30/2013, 11/06/2014, 10/22/2015, 10/04/2019   Influenza-Unspecified 12/07/2012, 10/30/2013, 11/06/2014, 10/22/2015, 10/16/2016, 10/29/2017, 11/08/2020   Moderna Sars-Covid-2 Vaccination 02/07/2019, 03/07/2019   Pneumococcal Conjugate-13 10/30/2013   Zoster, Live 06/22/2012    Past Medical History:  Diagnosis Date   Arthritis    Asthma    Chronic kidney disease    STAGE 3   Colon cancer ([place]) 1982   Partial Colon resection   COPD (chronic obstructive pulmonary disease) ([place])    CHRONIC BRONCHITIS   Cough    CHRONIC BRONCHITIS   Hypertension    Prostate cancer ([place]) 2000   Prostatectomy    Tobacco History: Social History   Tobacco Use  Smoking Status Former   Types: Cigarettes   Quit date: 08/19/1980   Years since quitting: 41.6  Smokeless Tobacco Never   Counseling given: Not Answered   Outpatient Medications Prior to Visit  Medication Sig Dispense Refill   acetaminophen (TYLENOL) 650 MG CR tablet Take 1,300 mg by mouth in the morning and at bedtime.      albuterol (PROVENTIL HFA;VENTOLIN HFA) 108 (90 BASE) MCG/ACT inhaler Inhale 1 puff into the lungs See admin instructions. Use 1 puff twice daily (scheduled) & may use every 6 hours if needed for shortness of breath/wheezing.     albuterol (VENTOLIN HFA) 108 (90 Base) MCG/ACT inhaler Inhale 2 puffs into the lungs every 6 (six) hours as needed  for wheezing or shortness of breath. 8 g 2   aspirin EC 81 MG tablet Take 162 mg by mouth daily. Swallow whole.     CINNAMON PO Take 1,000 mg by mouth daily.     Cyanocobalamin (VITAMIN B-12) 2500 MCG SUBL Place 2,500 mcg under the tongue daily.     doxazosin (CARDURA) 8 MG tablet Take 8 mg by mouth at bedtime.      fluticasone (FLONASE) 50 MCG/ACT nasal spray Place 1 spray into both nostrils daily.       lisinopril-hydrochlorothiazide (PRINZIDE,ZESTORETIC) 20-12.5 MG per tablet Take 1 tablet by mouth daily.      Propylene Glycol 0.6 % SOLN Place 1 drop into both eyes 2 (two) times daily.     simvastatin (ZOCOR) 40 MG tablet Take 40 mg by mouth at bedtime.      Spacer/Aero-Holding Chambers (AEROCHAMBER MV) inhaler Use as instructed 1 each 0   Tiotropium Bromide-Olodaterol 2.5-2.5 MCG/ACT AERS Inhale 2 puffs into the lungs daily.      traMADol (ULTRAM) 50 MG tablet Take 50 mg by mouth every morning.      traZODone (DESYREL) 50 MG tablet Take 50 mg by mouth at bedtime.     No facility-administered medications prior to visit.     Review of Systems:   Constitutional: No weight loss or gain, night sweats, fevers, chills, fatigue, or lassitude. HEENT: No headaches, difficulty swallowing, tooth/dental problems, or sore throat. No sneezing, itching, ear ache, nasal congestion, or post nasal drip CV:  No chest pain, orthopnea, PND, swelling in lower extremities, anasarca, dizziness, palpitations, syncope Resp: +shortness of breath with exertion; productive cough; chest congestion. No hemoptysis. No wheezing.  No chest wall deformity GI:  No heartburn, indigestion, abdominal pain, nausea, vomiting, diarrhea, change in bowel habits, loss of appetite, bloody stools.  GU: No dysuria, change in color of urine, urgency or frequency.   Skin: No rash, lesions, ulcerations MSK:  No joint pain or swelling.   Neuro: No dizziness or lightheadedness.  Psych: No depression or anxiety. Mood stable.     Physical Exam:  BP 110/64   Pulse 84   Ht '5\' 10"'$  (1.778 m)   Wt 213 lb 6.4 oz (96.8 kg)   SpO2 96%   BMI 30.62 kg/m   GEN: Pleasant, interactive, well-appearing; in no acute distress. HEENT:  Normocephalic and atraumatic. PERRLA. Sclera white. Nasal turbinates pink, moist and patent bilaterally. No rhinorrhea present. Oropharynx pink and moist, without exudate or edema. No lesions, ulcerations, or  postnasal drip.  NECK:  Supple w/ fair ROM. No JVD present. Normal carotid impulses w/o bruits. Thyroid symmetrical with no goiter or nodules palpated. No lymphadenopathy.   CV: RRR, no m/r/g, no peripheral edema. Pulses intact, +2 bilaterally. No cyanosis, pallor or clubbing. PULMONARY:  Unlabored, regular breathing. Bibasilar crackles with minimal expiratory wheezes bilaterally A&P. No accessory muscle use.  GI: BS present and normoactive. Soft, non-tender to palpation. No organomegaly or masses detected.  MSK: No erythema, warmth or tenderness. Cap refil <2 sec all extrem. No deformities or joint swelling noted.  Neuro: A/Ox3. No focal deficits noted.   Skin: Warm, no lesions or rashe Psych: Normal affect and behavior. Judgement and thought content appropriate.     Lab Results:  CBC    Component Value Date/Time   WBC 4.8 08/26/2021 1553   RBC 4.38 08/26/2021 1553   HGB 13.7 08/26/2021 1553   HCT 41.1 08/26/2021 1553   PLT 223 08/26/2021 1553   MCV 93.8  08/26/2021 1553   MCH 31.3 08/26/2021 1553   MCHC 33.3 08/26/2021 1553   RDW 12.1 08/26/2021 1553   LYMPHSABS 0.7 08/26/2021 1553   MONOABS 0.0 (L) 08/26/2021 1553   EOSABS 0.0 08/26/2021 1553   BASOSABS 0.0 08/26/2021 1553    BMET    Component Value Date/Time   NA 134 (L) 08/26/2021 1553   K 3.7 08/26/2021 1553   CL 100 08/26/2021 1553   CO2 18 (L) 08/26/2021 1553   GLUCOSE 136 (H) 08/26/2021 1553   BUN 45 (H) 08/26/2021 1553   CREATININE 2.20 (H) 08/26/2021 1553   CALCIUM 8.8 (L) 08/26/2021 1553   GFRNONAA 29 (L) 08/26/2021 1553   GFRAA 57 (L) 01/06/2018 1044    BNP No results found for: "BNP"   Imaging:  DG Chest 2 View  Result Date: 03/25/2022 CLINICAL DATA:  productive cough x 3 weeks EXAM: CHEST - 2 VIEW COMPARISON:  Radiograph 03/26/2021 FINDINGS: Unchanged cardiomediastinal silhouette. Emphysema with scattered lung scarring, similar to prior. There is no new airspace disease. No large effusion or  evidence of pneumothorax. Thoracic spondylosis. IMPRESSION: Chronic lung findings.  No new focal airspace disease. Electronically Signed   By: Maurine Simmering M.D.   On: 03/25/2022 12:44          No data to display          No results found for: "NITRICOXIDE"      Assessment & Plan:   COPD with acute exacerbation ([place]) Previous diagnosis of COPD, maintain on LAMA/LABA therapy. He had pulmonary function testing completed today without formal obstruction; however, he did use his inhaler 3 hours prior to testing so possible this skewed the results. He does have emphysema and has a moderate diffusion defect, which is likely related to this as well as his underlying IPF. Appears to be having a mild exacerbation today with increased productive cough and dyspnea. We will treat him with prednisone burst and empiric doxycycline. CXR to rule out superimposed infection.  He has received good benefit from use of Stiolto; we will continue him on this. Action plan in place.   Patient Instructions  Continue Albuterol inhaler 2 puffs every 6 hours as needed for shortness of breath or wheezing. Notify if symptoms persist despite rescue inhaler/neb use.  Continue Stiolto 2 puffs daily  Prednisone 40 mg daily for 5 days. Take in AM with food.  Doxycycline 1 tab Twice daily for 7 days. Take with food. Wear sunscreen when outside as this increases your risk for sunburns Guaifenesin [phone] mg over the counter Twice daily for chest congestion   Your pulmonary function testing and previous imaging are concerning for underlying pulmonary fibrosis. After our discussion today, we will refrain from further workup for now and continue to monitor your symptoms  Follow up in 2 weeks with Dr. Shearon Stalls or Joellen Jersey Morgana Rowley,NP to make sure you are feeling better. If symptoms do not improve or worsen, please contact office for sooner follow up or seek emergency care.    IPF (idiopathic pulmonary fibrosis) (HCC) Mild pulmonary  fibrosis on previous CT imaging from 2022. Will request most recent CT chest from his PCP at [facility] clinic. His PFTs today showed a restrictive pattern, consistent with underlying ILD. He also has crackles at the lung bases. Disease prognosis and progression discussed at length. Not interested in antifibrotic therapy or additional workup. He would like to hold off on further imaging and shared decision to more forward with watchful waiting.    I  spent 35 minutes of dedicated to the care of this patient on the date of this encounter to include pre-visit review of records, face-to-face time with the patient discussing conditions above, post visit ordering of testing, clinical documentation with the electronic health record, making appropriate referrals as documented, and communicating necessary findings to members of the patients care team.  Clayton Bibles, NP 03/26/2022  Pt aware and understands NP's role.

## 2022-03-26 ENCOUNTER — Encounter: Payer: Self-pay | Admitting: Nurse Practitioner

## 2022-03-29 LAB — PULMONARY FUNCTION TEST
DL/VA % pred: 130 %
DL/VA: 4.97 ml/min/mmHg/L
DLCO cor % pred: 95 %
DLCO cor: 22.76 ml/min/mmHg
DLCO unc % pred: 63 %
DLCO unc: 15.04 ml/min/mmHg
FEF 25-75 Post: 2.34 L/sec
FEF 25-75 Pre: 2.55 L/sec
FEF2575-%Change-Post: -8 %
FEF2575-%Pred-Post: 135 %
FEF2575-%Pred-Pre: 147 %
FEV1-%Change-Post: -1 %
FEV1-%Pred-Post: 85 %
FEV1-%Pred-Pre: 86 %
FEV1-Post: 2.26 L
FEV1-Pre: 2.29 L
FEV1FVC-%Change-Post: -2 %
FEV1FVC-%Pred-Pre: 115 %
FEV6-%Change-Post: 1 %
FEV6-%Pred-Post: 80 %
FEV6-%Pred-Pre: 78 %
FEV6-Post: 2.84 L
FEV6-Pre: 2.79 L
FEV6FVC-%Change-Post: 0 %
FEV6FVC-%Pred-Post: 107 %
FEV6FVC-%Pred-Pre: 107 %
FVC-%Change-Post: 1 %
FVC-%Pred-Post: 75 %
FVC-%Pred-Pre: 74 %
FVC-Post: 2.87 L
FVC-Pre: 2.82 L
Post FEV1/FVC ratio: 79 %
Post FEV6/FVC ratio: 100 %
Pre FEV1/FVC ratio: 81 %
Pre FEV6/FVC Ratio: 100 %
RV % pred: 88 %
RV: 2.45 L
TLC % pred: 78 %
TLC: 5.53 L

## 2022-04-15 ENCOUNTER — Other Ambulatory Visit (HOSPITAL_COMMUNITY): Payer: Self-pay

## 2022-04-15 ENCOUNTER — Ambulatory Visit: Payer: Medicare Other | Admitting: Internal Medicine

## 2022-04-15 ENCOUNTER — Telehealth: Payer: Self-pay | Admitting: Pharmacist

## 2022-04-15 ENCOUNTER — Encounter: Payer: Self-pay | Admitting: Internal Medicine

## 2022-04-15 VITALS — BP 140/74 | HR 87 | Temp 98.1°F | Ht 70.0 in | Wt 212.8 lb

## 2022-04-15 DIAGNOSIS — J410 Simple chronic bronchitis: Secondary | ICD-10-CM

## 2022-04-15 DIAGNOSIS — J84112 Idiopathic pulmonary fibrosis: Secondary | ICD-10-CM

## 2022-04-15 DIAGNOSIS — Z5181 Encounter for therapeutic drug level monitoring: Secondary | ICD-10-CM | POA: Diagnosis not present

## 2022-04-15 LAB — COMPREHENSIVE METABOLIC PANEL
ALT: 14 U/L (ref 0–53)
AST: 15 U/L (ref 0–37)
Albumin: 4.2 g/dL (ref 3.5–5.2)
Alkaline Phosphatase: 46 U/L (ref 39–117)
BUN: 25 mg/dL — ABNORMAL HIGH (ref 6–23)
CO2: 21 mEq/L (ref 19–32)
Calcium: 10.1 mg/dL (ref 8.4–10.5)
Chloride: 104 mEq/L (ref 96–112)
Creatinine, Ser: 1.45 mg/dL (ref 0.40–1.50)
GFR: 44.07 mL/min — ABNORMAL LOW (ref >60.00)
Glucose, Bld: 104 mg/dL — ABNORMAL HIGH (ref 70–99)
Potassium: 4.1 mEq/L (ref 3.5–5.1)
Sodium: 136 mEq/L (ref 135–145)
Total Bilirubin: 0.4 mg/dL (ref 0.2–1.2)
Total Protein: 7.4 g/dL (ref 6.0–8.3)

## 2022-04-15 MED ORDER — OFEV 150 MG PO CAPS: 150.0000 mg | ORAL_CAPSULE | Freq: Two times a day (BID) | ORAL | 1 refills | Status: DC

## 2022-04-15 NOTE — Progress Notes (Signed)
Scott Branch    [id number]    03-16-1935  Primary Care Physician:Anderson, Ocie Cornfield, MD Date of Appointment: 04/15/2022 Established Patient Visit  Chief complaint:   Chief Complaint  Patient presents with   Follow-up    No c/o      HPI: Scott Branch is a 87 y.o. man with COPD and bibasilar fibrosis  Interval Updates: Here for follow up after PFTS which show mild restriction to ventilation.  He continues to have dyspnea with exertion. No cough.  Still on anoro with prn albuterol.  No interval hospitalizations or ED visits.  No reflux. No cough.   I have reviewed the patient's family social and past medical history and updated as appropriate.   Past Medical History:  Diagnosis Date   Arthritis    Asthma    Chronic kidney disease    STAGE 3   Colon cancer ([place]) 1982   Partial Colon resection   COPD (chronic obstructive pulmonary disease) ([place])    CHRONIC BRONCHITIS   Cough    CHRONIC BRONCHITIS   Hypertension    Prostate cancer ([place]) 2000   Prostatectomy    Past Surgical History:  Procedure Laterality Date   BLEPHAROPLASTY Bilateral 2020   COLON SURGERY     EYE SURGERY     Bilateral cataract surgery.   HERNIA REPAIR     X 2 INGUINAL   KNEE ARTHROSCOPY Left 08/27/2017   Procedure: ARTHROSCOPY KNEE  PARTIAL MEDIAL MENISECTOMY CHONDROPLASTY;  Surgeon: Dereck Leep, MD;  Location: [facility] ORS;  Service: Orthopedics;  Laterality: Left;   KNEE ARTHROSCOPY Right 08/28/2019   Procedure: ARTHROSCOPY KNEE;  Surgeon: Dereck Leep, MD;  Location: [facility] ORS;  Service: Orthopedics;  Laterality: Right;   PROSTATECTOMY     SHOULDER ARTHROSCOPY WITH OPEN ROTATOR CUFF REPAIR Right 01/13/2018   Procedure: SHOULDER ARTHROSCOPY WITH OPEN ROTATOR CUFF REPAIR;  Surgeon: Corky Mull, MD;  Location: [facility] OR[facility];  Service: Orthopedics;  Laterality: Right;    Family History  Problem Relation Age of Onset   Lung disease Neg Hx    Lung cancer Neg Hx      Social History   Occupational History   Not on file  Tobacco Use   Smoking status: Former    Types: Cigarettes    Quit date: 08/19/1980    Years since quitting: 41.6   Smokeless tobacco: Never  Vaping Use   Vaping Use: Never used  Substance and Sexual Activity   Alcohol use: Yes    Alcohol/week: 1.0 standard drink of alcohol    Types: 1 Cans of beer per week    Comment: OCCAS   Drug use: Never   Sexual activity: Yes     Physical Exam: Blood pressure (!) 140/74, pulse 87, temperature 98.1 F (36.7 C), temperature source Oral, height 5\' 10"  (1.778 m), weight 212 lb 12.8 oz (96.5 kg), SpO2 94 %.  Gen:      No acute distress ENT:  no nasal polyps, mucus membranes moist Lungs:    bibasilar crackles, no wheezes CV:         Regular rate and rhythm; no murmurs, rubs, or gallops.  No pedal edema   Data Reviewed: Imaging: I have personally reviewed the CT chest Feb 2022 bibasilar fibrosis and honeycombing.   PFTs:     Latest Ref Rng & Units 03/25/2022    9:54 AM  PFT Results  FVC-Pre L 2.82   FVC-Predicted Pre %  74   FVC-Post L 2.87   FVC-Predicted Post % 75   Pre FEV1/FVC % % 81   Post FEV1/FCV % % 79   FEV1-Pre L 2.29   FEV1-Predicted Pre % 86   FEV1-Post L 2.26   DLCO uncorrected ml/min/mmHg 15.04   DLCO UNC% % 63   DLCO corrected ml/min/mmHg 22.76   DLCO COR %Predicted % 95   DLVA Predicted % 130   TLC L 5.53   TLC % Predicted % 78   RV % Predicted % 88    I have personally reviewed the patient's PFTs and mild restriction to ventilation and reduced dlco.   Labs: Lab Results  Component Value Date   NA 134 (L) 08/26/2021   K 3.7 08/26/2021   CO2 18 (L) 08/26/2021   GLUCOSE 136 (H) 08/26/2021   BUN 45 (H) 08/26/2021   CREATININE 2.20 (H) 08/26/2021   CALCIUM 8.8 (L) 08/26/2021   GFRNONAA 29 (L) 08/26/2021     Immunization status: Immunization History  Administered Date(s) Administered   Influenza Inj Mdck Quad Pf 10/14/2018   Influenza,  Seasonal, Injecte, Preservative Fre 12/16/2004, 11/24/2009, 10/26/2011   Influenza,inj,Quad PF,6+ Mos 10/30/2013, 11/06/2014, 10/22/2015, 10/04/2019   Influenza-Unspecified 12/07/2012, 10/30/2013, 11/06/2014, 10/22/2015, 10/16/2016, 10/29/2017, 11/08/2020   Moderna Sars-Covid-2 Vaccination 02/07/2019, 03/07/2019   Pneumococcal Conjugate-13 10/30/2013   Zoster, Live 06/22/2012    External Records Personally Reviewed: pulmonary, pcp.   Assessment:  IPF COPD with emphysema History of tobacco use disorder, outside window for lung cancer screening Encounter for drug monitoring high risk medication.   Plan/Recommendations: Reviewed PFTs with restriction to ventilation. Discussed risks and benefits of treatment.  Agreeable to starting Ofev for IPF. 150 mg twice a day.  Blood work today to check your liver and kidney function. Reviewed Side effects: diarrhea, nausea, vomiting, weight loss. Please let me know about these sooner if they occur.   Repeat CMET in 1 month.   Continue anoro, albuterol. PFTs in 6 months. May consider repeat scan at that time depending on how pfts look.    Return to Care: Return in about 3 months (around 07/16/2022).   Lenice Llamas, MD Pulmonary and [place]

## 2022-04-15 NOTE — Patient Instructions (Addendum)
Please schedule follow up scheduled with myself in 3 months.  If my schedule is not open yet, we will contact you with a reminder closer to that time. Please call [phone] if you haven't heard from [place] a month before.   Blood work today to check your liver and kidney function. I am sending Ofev the anti-fibrotic medication to your mail order pharmacy.  Side effects: diarrhea, nausea, vomiting, weight loss. Please let me know about these sooner.   You'll need to check labs again in 1 month to make sure your kidney and liver are ok at any [place] lab.   I will see you in 3 months to see how you are tolerating the medication.  Ok to keep taking inhalers.

## 2022-04-15 NOTE — Telephone Encounter (Addendum)
Received notification that is new start to Ofev  Initiated a Prior Authorization request to [facility] for OFEV via CoverMyMeds. Pending OV note from today to be signed  Key: [id number]  Knox Saliva, PharmD, MPH, BCPS, CPP Clinical Pharmacist (Rheumatology and Pulmonology)  ----- Message from Spero Geralds, MD sent at 04/15/2022  1:35 PM EDT ----- New start ofev - does this usually require PA?

## 2022-04-15 NOTE — Progress Notes (Signed)
Will start Ofev BIV in new phone encounter  Knox Saliva, PharmD, MPH, BCPS, CPP Clinical Pharmacist (Rheumatology and Pulmonology)

## 2022-04-16 NOTE — Telephone Encounter (Signed)
Clinical questions completed and OV note attached to Ofev PA request  Key: [id number]  Knox Saliva, PharmD, MPH, BCPS, CPP Clinical Pharmacist (Rheumatology and Pulmonology)

## 2022-04-22 ENCOUNTER — Other Ambulatory Visit (HOSPITAL_COMMUNITY): Payer: Self-pay

## 2022-04-22 NOTE — Telephone Encounter (Signed)
Received notification from [facility] regarding a prior authorization for [place]. Authorization has been APPROVED from 04/16/22 to 01/26/23. Approval letter sent to scan center.  Per test claim, copay for 30 day supply is $3287.59  Patient must fill through [place]: [phone]   Authorization # [id number]  [employer] application placed in Dr. Mauricio Po mailbox for signature. ATC patient to discuss his part of application but unable to reach. VM box is not set up either. Application has been mailed to patient with note to return to pharmacy team in clinic with income docs for household  Knox Saliva, PharmD, MPH, BCPS, CPP Clinical Pharmacist (Rheumatology and Pulmonology)

## 2022-05-05 NOTE — Telephone Encounter (Signed)
Received signed provider form for [facility] application. Placed in awaiting response folder in pharmacy office. ATC patient to determine if he received his portion in mail. Phone kept ringing and led to busy tone again so unable to leave VM  Chesley Mires, PharmD, MPH, BCPS, CPP Clinical Pharmacist (Rheumatology and Pulmonology)

## 2022-05-06 ENCOUNTER — Ambulatory Visit: Payer: Medicare Other | Admitting: Dermatology

## 2022-05-06 VITALS — BP 135/73

## 2022-05-06 DIAGNOSIS — L570 Actinic keratosis: Secondary | ICD-10-CM

## 2022-05-06 DIAGNOSIS — L578 Other skin changes due to chronic exposure to nonionizing radiation: Secondary | ICD-10-CM

## 2022-05-06 NOTE — Telephone Encounter (Signed)
He is here and states he NL wants this medicine due to the hoops he's had to go thru. He stated he did get something in the mail but hesitated to give his personal information.States he makes too much to qualify and he will speak to Dr. Celine Mans next appt.

## 2022-05-06 NOTE — Telephone Encounter (Signed)
ATC patient to further discuss. This has been my third attempt to reach patient. Left VM with my direct number if he would like to further discuss Ofev status. Unfortunately, pharmacy team has not been able to directly communicate with patient regarding this process. Please let us know if follow-up is needed after OV.  If he does not qualify for pt assistance program, the copay through his insurance as of 04/22/22 is $3287.59 for a 30 day supply. His maximum out-of-pocket for the year 2024 for all medications is supposed to be around $3500. Therefore, his copay for subsequent fill would likely be closer to $300. Once he has met out-of-pocket max for the year, all prescription copays drop to $0 (this is based on Inflation Reduction Act that was passed federally)  Chesley Mires, PharmD, MPH, BCPS, CPP Clinical Pharmacist (Rheumatology and Pulmonology)

## 2022-05-06 NOTE — Patient Instructions (Signed)
Cryotherapy Aftercare  Wash gently with soap and water everyday.   Apply Vaseline and Band-Aid daily until healed.     Due to recent changes in healthcare laws, you may see results of your pathology and/or laboratory studies on MyChart before the doctors have had a chance to review them. We understand that in some cases there may be results that are confusing or concerning to you. Please understand that not all results are received at the same time and often the doctors may need to interpret multiple results in order to provide you with the best plan of care or course of treatment. Therefore, we ask that you please give us 2 business days to thoroughly review all your results before contacting the office for clarification. Should we see a critical lab result, you will be contacted sooner.   If You Need Anything After Your Visit  If you have any questions or concerns for your doctor, please call our main line at [phone] and press option 4 to reach your doctor's medical assistant. If no one answers, please leave a voicemail as directed and we will return your call as soon as possible. Messages left after 4 pm will be answered the following business day.   You may also send us a message via MyChart. We typically respond to MyChart messages within 1-2 business days.  For prescription refills, please ask your pharmacy to contact our office. Our fax number is [phone].  If you have an urgent issue when the clinic is closed that cannot wait until the next business day, you can page your doctor at the number below.    Please note that while we do our best to be available for urgent issues outside of office hours, we are not available 24/7.   If you have an urgent issue and are unable to reach us, you may choose to seek medical care at your doctor's office, retail clinic, urgent care center, or emergency room.  If you have a medical emergency, please immediately call 911 or go to the  emergency department.  Pager Numbers  - Dr. Kowalski: [phone]  - Dr. Moye: [phone]  - Dr. Stewart: [phone]  In the event of inclement weather, please call our main line at [phone] for an update on the status of any delays or closures.  Dermatology Medication Tips: Please keep the boxes that topical medications come in in order to help keep track of the instructions about where and how to use these. Pharmacies typically print the medication instructions only on the boxes and not directly on the medication tubes.   If your medication is too expensive, please contact our office at [phone] option 4 or send us a message through MyChart.   We are unable to tell what your co-pay for medications will be in advance as this is different depending on your insurance coverage. However, we may be able to find a substitute medication at lower cost or fill out paperwork to get insurance to cover a needed medication.   If a prior authorization is required to get your medication covered by your insurance company, please allow us 1-2 business days to complete this process.  Drug prices often vary depending on where the prescription is filled and some pharmacies may offer cheaper prices.  The website www.goodrx.com contains coupons for medications through different pharmacies. The prices here do not account for what the cost may be with help from insurance (it may be cheaper with your insurance), but the website can   give you the price if you did not use any insurance.  - You can print the associated coupon and take it with your prescription to the pharmacy.  - You may also stop by our office during regular business hours and pick up a GoodRx coupon card.  - If you need your prescription sent electronically to a different pharmacy, notify our office through [place] MyChart or by phone at [phone] option 4.     Si Usted Necesita Algo Despus de Su Visita  Tambin puede  enviarnos un mensaje a travs de MyChart. Por lo general respondemos a los mensajes de MyChart en el transcurso de 1 a 2 das hbiles.  Para renovar recetas, por favor pida a su farmacia que se ponga en contacto con nuestra oficina. Nuestro nmero de fax es el [phone].  Si tiene un asunto urgente cuando la clnica est cerrada y que no puede esperar hasta el siguiente da hbil, puede llamar/localizar a su doctor(a) al nmero que aparece a continuacin.   Por favor, tenga en cuenta que aunque hacemos todo lo posible para estar disponibles para asuntos urgentes fuera del horario de oficina, no estamos disponibles las 24 horas del da, los 7 das de la semana.   Si tiene un problema urgente y no puede comunicarse con nosotros, puede optar por buscar atencin mdica  en el consultorio de su doctor(a), en una clnica privada, en un centro de atencin urgente o en una sala de emergencias.  Si tiene una emergencia mdica, por favor llame inmediatamente al 911 o vaya a la sala de emergencias.  Nmeros de bper  - Dr. Kowalski: [phone]  - Dra. Moye: [phone]  - Dra. Stewart: [phone]  En caso de inclemencias del tiempo, por favor llame a nuestra lnea principal al [phone] para una actualizacin sobre el estado de cualquier retraso o cierre.  Consejos para la medicacin en dermatologa: Por favor, guarde las cajas en las que vienen los medicamentos de uso tpico para ayudarle a seguir las instrucciones sobre dnde y cmo usarlos. Las farmacias generalmente imprimen las instrucciones del medicamento slo en las cajas y no directamente en los tubos del medicamento.   Si su medicamento es muy caro, por favor, pngase en contacto con nuestra oficina llamando al [phone] y presione la opcin 4 o envenos un mensaje a travs de MyChart.   No podemos decirle cul ser su copago por los medicamentos por adelantado ya que esto es diferente dependiendo de la cobertura de su seguro.  Sin embargo, es posible que podamos encontrar un medicamento sustituto a menor costo o llenar un formulario para que el seguro cubra el medicamento que se considera necesario.   Si se requiere una autorizacin previa para que su compaa de seguros cubra su medicamento, por favor permtanos de 1 a 2 das hbiles para completar este proceso.  Los precios de los medicamentos varan con frecuencia dependiendo del lugar de dnde se surte la receta y alguna farmacias pueden ofrecer precios ms baratos.  El sitio web www.goodrx.com tiene cupones para medicamentos de diferentes farmacias. Los precios aqu no tienen en cuenta lo que podra costar con la ayuda del seguro (puede ser ms barato con su seguro), pero el sitio web puede darle el precio si no utiliz ningn seguro.  - Puede imprimir el cupn correspondiente y llevarlo con su receta a la farmacia.  - Tambin puede pasar por nuestra oficina durante el horario de atencin regular y recoger una tarjeta de cupones de GoodRx.  -   Si necesita que su receta se enve electrnicamente a una farmacia diferente, informe a nuestra oficina a travs de MyChart de [place] o por telfono llamando al [phone] y presione la opcin 4.  

## 2022-05-06 NOTE — Progress Notes (Signed)
   Follow-Up Visit   Subjective  Scott Branch is a 87 y.o. male who presents for the following: Actinic keratosis Of face, neck, scalp, ears treated with LN2. He has had blue light treatments in the past. The patient has spots, moles and lesions to be evaluated, some may be new or changing and the patient has concerns that these could be cancer.  The following portions of the chart were reviewed this encounter and updated as appropriate: medications, allergies, medical history  Review of Systems:  No other skin or systemic complaints except as noted in HPI or Assessment and Plan.  Objective  Well appearing patient in no apparent distress; mood and affect are within normal limits. A focused examination was performed of the following areas: Face, scalp, neck Relevant exam findings are noted in the Assessment and Plan.   Assessment & Plan    ACTINIC KERATOSIS  Exam: Erythematous thin papules/macules with gritty scale  Actinic keratoses are precancerous spots that appear secondary to cumulative UV radiation exposure/sun exposure over time. They are chronic with expected duration over 1 year. A portion of actinic keratoses will progress to squamous cell carcinoma of the skin. It is not possible to reliably predict which spots will progress to skin cancer and so treatment is recommended to prevent development of skin cancer.  Recommend daily broad spectrum sunscreen SPF 30+ to sun-exposed areas, reapply every 2 hours as needed.  Recommend staying in the shade or wearing long sleeves, sun glasses (UVA+UVB protection) and wide brim hats (4-inch brim around the entire circumference of the hat). Call for new or changing lesions.  Treatment Plan: Destruction Procedure Note Destruction method: cryotherapy   Informed consent: discussed and consent obtained   Lesion destroyed using liquid nitrogen: Yes   Outcome: patient tolerated procedure well with no complications   Post-procedure  details: wound care instructions given   Locations: face, scalp, neck # of Lesions Treated: 9  Prior to procedure, discussed risks of blister formation, small wound, skin dyspigmentation, or rare scar following cryotherapy. Recommend Vaseline ointment to treated areas while healing.  May plan PDT in the future.  ACTINIC DAMAGE - chronic, secondary to cumulative UV radiation exposure/sun exposure over time - diffuse scaly erythematous macules with underlying dyspigmentation - Recommend daily broad spectrum sunscreen SPF 30+ to sun-exposed areas, reapply every 2 hours as needed.  - Recommend staying in the shade or wearing long sleeves, sun glasses (UVA+UVB protection) and wide brim hats (4-inch brim around the entire circumference of the hat). - Call for new or changing lesions.  Return in about 6 months (around 11/05/2022) for [place] follow up.  I, Joanie Coddington, CMA, am acting as scribe for Armida Sans, MD .  Documentation: I have reviewed the above documentation for accuracy and completeness, and I agree with the above.  Armida Sans, MD

## 2022-05-24 ENCOUNTER — Encounter: Payer: Self-pay | Admitting: Dermatology

## 2022-09-21 ENCOUNTER — Ambulatory Visit: Payer: Medicare Other | Admitting: Internal Medicine

## 2022-09-21 ENCOUNTER — Encounter: Payer: Self-pay | Admitting: Internal Medicine

## 2022-09-21 VITALS — BP 142/78 | HR 65 | Ht 70.0 in | Wt 212.6 lb

## 2022-09-21 DIAGNOSIS — J4489 Other specified chronic obstructive pulmonary disease: Secondary | ICD-10-CM | POA: Diagnosis not present

## 2022-09-21 DIAGNOSIS — J84112 Idiopathic pulmonary fibrosis: Secondary | ICD-10-CM | POA: Diagnosis not present

## 2022-09-21 DIAGNOSIS — J439 Emphysema, unspecified: Secondary | ICD-10-CM

## 2022-09-21 DIAGNOSIS — Z01811 Encounter for preprocedural respiratory examination: Secondary | ICD-10-CM

## 2022-09-21 NOTE — Progress Notes (Signed)
Scott Branch    [id number]    01-06-1936  Primary Care Physician:Anderson, Marya Amsler, MD Date of Appointment: 09/21/2022 Established Patient Visit  Chief complaint:   Chief Complaint  Patient presents with   Follow-up    No concerns      HPI: Scott Branch is a 87 y.o. 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 Never started ofev. Didn't want to discuss his financial information with anyone. Feels his breathing is doing fine.   Dr. Ernest Pine in [place] is doing his knee surgery - right knee replacement in October.   Breathing is doing ok. He does have some daily dry cough usually worse in the evening. Occasionally some phlegm.  No interval hospitalizations or ED visits.   Doing ok on anoro once daily. Uses albuterol about twice a day. Feels he does well on this.  They just finished a bus tour of all the light houses in [place].      09/21/2022    8:25 AM  CAT Score  Total CAT Score 7    I have reviewed the patient's family social and past medical history and updated as appropriate.   Past Medical History:  Diagnosis Date   Arthritis    Asthma    Chronic kidney disease    STAGE 3   Colon cancer (HCC) 1982   Partial Colon resection   COPD (chronic obstructive pulmonary disease) (HCC)    CHRONIC BRONCHITIS   Cough    CHRONIC BRONCHITIS   Hypertension    Prostate cancer (HCC) 2000   Prostatectomy    Past Surgical History:  Procedure Laterality Date   BLEPHAROPLASTY Bilateral 2020   COLON SURGERY     EYE SURGERY     Bilateral cataract surgery.   HERNIA REPAIR     X 2 INGUINAL   KNEE ARTHROSCOPY Left 08/27/2017   Procedure: ARTHROSCOPY KNEE  PARTIAL MEDIAL MENISECTOMY CHONDROPLASTY;  Surgeon: Donato Heinz, MD;  Location: [facility] ORS;  Service: Orthopedics;  Laterality: Left;   KNEE ARTHROSCOPY Right 08/28/2019   Procedure: ARTHROSCOPY KNEE;  Surgeon: Donato Heinz, MD;  Location: [facility] OR[facility];   Service: Orthopedics;  Laterality: Right;   PROSTATECTOMY     SHOULDER ARTHROSCOPY WITH OPEN ROTATOR CUFF REPAIR Right 01/13/2018   Procedure: SHOULDER ARTHROSCOPY WITH OPEN ROTATOR CUFF REPAIR;  Surgeon: Christena Flake, MD;  Location: [facility];  Service: Orthopedics;  Laterality: Right;    Family History  Problem Relation Age of Onset   Lung disease Neg Hx    Lung cancer Neg Hx     Social History   Occupational History   Not on file  Tobacco Use   Smoking status: Former    Current packs/day: 0.00    Types: Cigarettes    Quit date: 08/19/1980    Years since quitting: 42.1   Smokeless tobacco: Never  Vaping Use   Vaping status: Never Used  Substance and Sexual Activity   Alcohol use: Yes    Alcohol/week: 1.0 standard drink of alcohol    Types: 1 Cans of beer per week    Comment: OCCAS   Drug use: Never   Sexual activity: Yes     Physical Exam: Blood pressure (!) 142/78, pulse 65, height 5\' 10"  (1.778 m), weight 212 lb 9.6 oz (96.4 kg), SpO2 97%.  Gen:      No acute distress ENT:  no nasal polyps, mucus membranes  moist Lungs:    bibasilar crackles R>L CV:        RRR no mrg   Data Reviewed: Imaging: I have personally reviewed the CT chest Feb 2022 bibasilar fibrosis and honeycombing.   PFTs:     Latest Ref Rng & Units 03/25/2022    9:54 AM  PFT Results  FVC-Pre L 2.82   FVC-Predicted Pre % 74   FVC-Post L 2.87   FVC-Predicted Post % 75   Pre FEV1/FVC % % 81   Post FEV1/FCV % % 79   FEV1-Pre L 2.29   FEV1-Predicted Pre % 86   FEV1-Post L 2.26   DLCO uncorrected ml/min/mmHg 15.04   DLCO UNC% % 63   DLCO corrected ml/min/mmHg 22.76   DLCO COR %Predicted % 95   DLVA Predicted % 130   TLC L 5.53   TLC % Predicted % 78   RV % Predicted % 88    I have personally reviewed the patient's PFTs and mild restriction to ventilation and reduced dlco.   Labs: Lab Results  Component Value Date   NA 136 04/15/2022   K 4.1 04/15/2022   CO2 21 04/15/2022    GLUCOSE 104 (H) 04/15/2022   BUN 25 (H) 04/15/2022   CREATININE 1.45 04/15/2022   CALCIUM 10.1 04/15/2022   GFRNONAA 29 (L) 08/26/2021     Immunization status: Immunization History  Administered Date(s) Administered   Influenza Inj Mdck Quad Pf 10/14/2018   Influenza, Seasonal, Injecte, Preservative Fre 12/16/2004, 11/24/2009, 10/26/2011   Influenza,inj,Quad PF,6+ Mos 10/30/2013, 11/06/2014, 10/22/2015, 10/04/2019   Influenza-Unspecified 12/07/2012, 10/30/2013, 11/06/2014, 10/22/2015, 10/16/2016, 10/29/2017, 11/08/2020   Moderna Sars-Covid-2 Vaccination 02/07/2019, 03/07/2019   Pneumococcal Conjugate-13 10/30/2013   Zoster, Live 06/22/2012    External Records Personally Reviewed: pulmonary, pcp.   Assessment:  IPF COPD with emphysema History of tobacco use disorder, outside window for lung cancer screening Preoperative pulmonary evaluation.   Plan/Recommendations: For Continue anoro, albuterol. He would like to defer treatment now for anti-fibrotic therapy. Discussed risks and benefits on this. He prefers watchful waiting.  Will obtain spiro/dlco in 6 months.  Preoperative evaluation for knee surgery in October as below.   Preoperative Risk Calculation:The features of this patient's history that contribute to the pulmonary risk assessment include: Age, COPD, General anesthesia.   This patient has a low risk of post-operative pulmonary complications by ARISCAT Index.  The absolute assessment of risk/benefit of the procedure is deferred to the primary team's evaluation.  - Patient's Estimated risk of postoperative respiratory failure is 16 points 1.6% based on the ARISCAT Index.   0 to 25 points: Low risk: 1.6% pulmonary complication rate  26 to 44 points: Intermediate risk: 13.3% pulmonary complication rate  45 to 123 points: High risk: 42.1% pulmonary complication rate  Postoperative respiratory failure (PRF) is considered as failure to wean from mechanical ventilation  within 48 hours of surgery or unplanned intubation/reintubation postoperatively. The validated risk calculator provides a risk estimate of PRF and is anticipated to aid in surgical decision-making and informed patient consent.  However risk can be accepted given the potential benefit of this intervention and it is not prohibitive.  RECOMMENDATIONS:  In order to minimize the risk of complications and optimize pulmonary status, we recommend the following: - Encourage aggressive incentive spirometry hourly both peri-operatively and post-operatively as tolerated  - Early ambulation and physical therapy as tolerated post-operatively - Bronchodilators as needed for wheezing or shortness of breath - Oral steroids only if the patient appears to  have component of COPD exacerbation, otherwise no routine utilization needed - Intraoperatively keep OR time to the shortest as possible   ARISCAT: Mazo et al. Anesthesiology [phone]  Return to Care: Return in about 6 months (around 03/24/2023) for follow up with PFT and same day appointment.   Durel Salts, MD Pulmonary and Critical Care Medicine [facility] Office:[phone]

## 2022-09-21 NOTE — Patient Instructions (Addendum)
Please schedule follow up scheduled with myself in 6 months.  If my schedule is not open yet, we will contact you with a reminder closer to that time. Please call [phone] if you haven't heard from [place] a month before.   Before your next visit I would like you to have:  Spirometry/DLCO - 30 minutes   Please send my office a message if you need refills of anoro or albuterol before our 6 month follow up. It was last refilled by your previous doctor.

## 2022-10-21 NOTE — Discharge Instructions (Signed)

## 2022-10-30 ENCOUNTER — Other Ambulatory Visit: Payer: Self-pay

## 2022-10-30 ENCOUNTER — Encounter
Admission: RE | Admit: 2022-10-30 | Discharge: 2022-10-30 | Disposition: A | Discharge status: Home / Self Care | Payer: Medicare Other | Source: Ambulatory Visit | Attending: Orthopedic Surgery | Admitting: Orthopedic Surgery

## 2022-10-30 VITALS — BP 147/77 | HR 77 | Resp 16 | Ht 70.0 in | Wt 209.9 lb

## 2022-10-30 DIAGNOSIS — Z01810 Encounter for preprocedural cardiovascular examination: Secondary | ICD-10-CM | POA: Diagnosis not present

## 2022-10-30 DIAGNOSIS — Z01818 Encounter for other preprocedural examination: Secondary | ICD-10-CM | POA: Insufficient documentation

## 2022-10-30 DIAGNOSIS — M1711 Unilateral primary osteoarthritis, right knee: Secondary | ICD-10-CM | POA: Diagnosis not present

## 2022-10-30 DIAGNOSIS — J84112 Idiopathic pulmonary fibrosis: Secondary | ICD-10-CM

## 2022-10-30 DIAGNOSIS — Z01812 Encounter for preprocedural laboratory examination: Secondary | ICD-10-CM

## 2022-10-30 HISTORY — DX: Dyspnea, unspecified: R06.00

## 2022-10-30 HISTORY — DX: Pneumonia, unspecified organism: J18.9

## 2022-10-30 LAB — URINALYSIS, ROUTINE W REFLEX MICROSCOPIC
Bilirubin Urine: NEGATIVE
Glucose, UA: NEGATIVE mg/dL
Hgb urine dipstick: NEGATIVE
Ketones, ur: NEGATIVE mg/dL
Leukocytes,Ua: NEGATIVE
Nitrite: NEGATIVE
Protein, ur: NEGATIVE mg/dL
Specific Gravity, Urine: 1.015 (ref 1.005–1.030)
pH: 5 (ref 5.0–8.0)

## 2022-10-30 LAB — COMPREHENSIVE METABOLIC PANEL
ALT: 18 U/L (ref 0–44)
AST: 18 U/L (ref 15–41)
Albumin: 4.4 g/dL (ref 3.5–5.0)
Alkaline Phosphatase: 49 U/L (ref 38–126)
Anion gap: 10 (ref 5–15)
BUN: 31 mg/dL — ABNORMAL HIGH (ref 8–23)
CO2: 22 mmol/L (ref 22–32)
Calcium: 9.5 mg/dL (ref 8.9–10.3)
Chloride: 102 mmol/L (ref 98–111)
Creatinine, Ser: 1.54 mg/dL — ABNORMAL HIGH (ref 0.61–1.24)
GFR, Estimated: 44 mL/min — ABNORMAL LOW (ref >60)
Glucose, Bld: 110 mg/dL — ABNORMAL HIGH (ref 70–99)
Potassium: 4.1 mmol/L (ref 3.5–5.1)
Sodium: 134 mmol/L — ABNORMAL LOW (ref 135–145)
Total Bilirubin: 0.8 mg/dL (ref 0.3–1.2)
Total Protein: 7.5 g/dL (ref 6.5–8.1)

## 2022-10-30 LAB — TYPE AND SCREEN
ABO/RH(D): O POS
Antibody Screen: NEGATIVE

## 2022-10-30 LAB — CBC
HCT: 40.5 % (ref 39.0–52.0)
Hemoglobin: 13.9 g/dL (ref 13.0–17.0)
MCH: 30.7 pg (ref 26.0–34.0)
MCHC: 34.3 g/dL (ref 30.0–36.0)
MCV: 89.4 fL (ref 80.0–100.0)
Platelets: 332 10*3/uL (ref 150–400)
RBC: 4.53 MIL/uL (ref 4.22–5.81)
RDW: 12.3 % (ref 11.5–15.5)
WBC: 15.1 10*3/uL — ABNORMAL HIGH (ref 4.0–10.5)
nRBC: 0 % (ref 0.0–0.2)

## 2022-10-30 LAB — SURGICAL PCR SCREEN
MRSA, PCR: NEGATIVE
Staphylococcus aureus: NEGATIVE

## 2022-10-30 LAB — SEDIMENTATION RATE: Sed Rate: 14 mm/h (ref 0–20)

## 2022-10-30 LAB — C-REACTIVE PROTEIN: CRP: 0.5 mg/dL (ref <1.0)

## 2022-10-30 NOTE — Patient Instructions (Addendum)
Your procedure is scheduled on: 11/04/22 - Wednesday Report to the Registration Desk on the 1st floor of the [facility]. To find out your arrival time, please call [phone] between 1PM - 3PM on: 11/03/22 - Tuesday If your arrival time is 6:00 am, do not arrive before that time as the [facility] entrance doors do not open until 6:00 am.  REMEMBER: Instructions that are not followed completely may result in serious medical risk, up to and including death; or upon the discretion of your surgeon and anesthesiologist your surgery may need to be rescheduled.  Do not eat food after midnight the night before surgery.  No gum chewing or hard candies.  You may however, drink CLEAR liquids up to 2 hours before you are scheduled to arrive for your surgery. Do not drink anything within 2 hours of your scheduled arrival time.  Clear liquids include: - water  - apple juice without pulp - gatorade (not RED colors) - black coffee or tea (Do NOT add milk or creamers to the coffee or tea) Do NOT drink anything that is not on this list.  In addition, your doctor has ordered for you to drink the provided:  Ensure Pre-Surgery Clear Carbohydrate Drink  Drinking this carbohydrate drink up to two hours before surgery helps to reduce insulin resistance and improve patient outcomes. Please complete drinking 2 hours before scheduled arrival time.  One week prior to surgery: Stop Anti-inflammatories (NSAIDS) such as Advil, Aleve, Ibuprofen, Motrin, Naproxen, Naprosyn and Aspirin based products such as Excedrin, Goody's Powder, BC Powder. You may however, continue to take Tylenol if needed for pain up until the day of surgery.  Stop ANY OVER THE COUNTER supplements until after surgery : CINNAMON   Continue taking all prescribed medications with the exception of the following:  Hold lisinopril-hydrochlorothiazide on the day of surgery.  TAKE ONLY THESE MEDICATIONS THE MORNING OF SURGERY WITH A SIP OF  WATER:  Tiotropium Bromide-Olodaterol  traMADol (ULTRAM)    Use inhaler albuterol (PROVENTIL HFA;VENTOLIN HFA)   on the day of surgery and bring to the hospital.   No Alcohol for 24 hours before or after surgery.  No Smoking including e-cigarettes for 24 hours before surgery.  No chewable tobacco products for at least 6 hours before surgery.  No nicotine patches on the day of surgery.  Do not use any "recreational" drugs for at least a week (preferably 2 weeks) before your surgery.  Please be advised that the combination of cocaine and anesthesia may have negative outcomes, up to and including death. If you test positive for cocaine, your surgery will be cancelled.  On the morning of surgery brush your teeth with toothpaste and water, you may rinse your mouth with mouthwash if you wish. Do not swallow any toothpaste or mouthwash.  Use CHG Soap or wipes as directed on instruction sheet.  Do not wear jewelry, make-up, hairpins, clips or nail polish.  For welded (permanent) jewelry: bracelets, anklets, waist bands, etc.  Please have this removed prior to surgery.  If it is not removed, there is a chance that hospital personnel will need to cut it off on the day of surgery.  Do not wear lotions, powders, or perfumes.   Do not shave body hair from the neck down 48 hours before surgery.  Contact lenses, hearing aids and dentures may not be worn into surgery.  Do not bring valuables to the hospital. [facility] is not responsible for any missing/lost belongings or valuables.  Notify your doctor if there is any change in your medical condition (cold, fever, infection).  Wear comfortable clothing (specific to your surgery type) to the hospital.  After surgery, you can help prevent lung complications by doing breathing exercises.  Take deep breaths and cough every 1-2 hours. Your doctor may order a device called an Incentive Spirometer to help you take deep breaths. When coughing or  sneezing, hold a pillow firmly against your incision with both hands. This is called "splinting." Doing this helps protect your incision. It also decreases belly discomfort.  If you are being admitted to the hospital overnight, leave your suitcase in the car. After surgery it may be brought to your room.  In case of increased patient census, it may be necessary for you, the patient, to continue your postoperative care in the Same Day Surgery department.  If you are being discharged the day of surgery, you will not be allowed to drive home. You will need a responsible individual to drive you home and stay with you for 24 hours after surgery.   If you are taking public transportation, you will need to have a responsible individual with you.  Please call the Pre-admissions Testing Dept. at [phone] if you have any questions about these instructions.  Surgery Visitation Policy:  Patients having surgery or a procedure may have two visitors.  Children under the age of 88 must have an adult with them who is not the patient.  Inpatient Visitation:    Visiting hours are 7 a.m. to 8 p.m. Up to four visitors are allowed at one time in a patient room. The visitors may rotate out with other people during the day.  One visitor age 30 or older may stay with the patient overnight and must be in the room by 8 p.m.    Pre-operative 5 CHG Bath Instructions   You can play a key role in reducing the risk of infection after surgery. Your skin needs to be as free of germs as possible. You can reduce the number of germs on your skin by washing with CHG (chlorhexidine gluconate) soap before surgery. CHG is an antiseptic soap that kills germs and continues to kill germs even after washing.   DO NOT use if you have an allergy to chlorhexidine/CHG or antibacterial soaps. If your skin becomes reddened or irritated, stop using the CHG and notify one of our RNs at [phone].   Please shower with the CHG  soap starting 4 days before surgery using the following schedule:  10/31/22 - 11/04/22.   Please keep in mind the following:  DO NOT shave, including legs and underarms, starting the day of your first shower.   You may shave your face at any point before/day of surgery.  Place clean sheets on your bed the day you start using CHG soap. Use a clean washcloth (not used since being washed) for each shower. DO NOT sleep with pets once you start using the CHG.   CHG Shower Instructions:  If you choose to wash your hair and private area, wash first with your normal shampoo/soap.  After you use shampoo/soap, rinse your hair and body thoroughly to remove shampoo/soap residue.  Turn the water OFF and apply about 3 tablespoons (45 ml) of CHG soap to a CLEAN washcloth.  Apply CHG soap ONLY FROM YOUR NECK DOWN TO YOUR TOES (washing for 3-5 minutes)  DO NOT use CHG soap on face, private areas, open wounds, or sores.  Pay special  attention to the area where your surgery is being performed.  If you are having back surgery, having someone wash your back for you may be helpful. Wait 2 minutes after CHG soap is applied, then you may rinse off the CHG soap.  Pat dry with a clean towel  Put on clean clothes/pajamas   If you choose to wear lotion, please use ONLY the CHG-compatible lotions on the back of this paper.     Additional instructions for the day of surgery: DO NOT APPLY any lotions, deodorants, cologne, or perfumes.   Put on clean/comfortable clothes.  Brush your teeth.  Ask your nurse before applying any prescription medications to the skin.      CHG Compatible Lotions   Aveeno Moisturizing lotion  Cetaphil Moisturizing Cream  Cetaphil Moisturizing Lotion  Clairol Herbal Essence Moisturizing Lotion, Dry Skin  Clairol Herbal Essence Moisturizing Lotion, Extra Dry Skin  Clairol Herbal Essence Moisturizing Lotion, Normal Skin  Curel Age Defying Therapeutic Moisturizing Lotion with Alpha  Hydroxy  Curel Extreme Care Body Lotion  Curel Soothing Hands Moisturizing Hand Lotion  Curel Therapeutic Moisturizing Cream, Fragrance-Free  Curel Therapeutic Moisturizing Lotion, Fragrance-Free  Curel Therapeutic Moisturizing Lotion, Original Formula  Eucerin Daily Replenishing Lotion  Eucerin Dry Skin Therapy Plus Alpha Hydroxy Crme  Eucerin Dry Skin Therapy Plus Alpha Hydroxy Lotion  Eucerin Original Crme  Eucerin Original Lotion  Eucerin Plus Crme Eucerin Plus Lotion  Eucerin TriLipid Replenishing Lotion  Keri Anti-Bacterial Hand Lotion  Keri Deep Conditioning Original Lotion Dry Skin Formula Softly Scented  Keri Deep Conditioning Original Lotion, Fragrance Free Sensitive Skin Formula  Keri Lotion Fast Absorbing Fragrance Free Sensitive Skin Formula  Keri Lotion Fast Absorbing Softly Scented Dry Skin Formula  Keri Original Lotion  Keri Skin Renewal Lotion Keri Silky Smooth Lotion  Keri Silky Smooth Sensitive Skin Lotion  Nivea Body Creamy Conditioning Oil  Nivea Body Extra Enriched Lotion  Nivea Body Original Lotion  Nivea Body Sheer Moisturizing Lotion Nivea Crme  Nivea Skin Firming Lotion  NutraDerm 30 Skin Lotion  NutraDerm Skin Lotion  NutraDerm Therapeutic Skin Cream  NutraDerm Therapeutic Skin Lotion  ProShield Protective Hand Cream  Provon moisturizing lotion  How to Use an Incentive Spirometer  An incentive spirometer is a tool that measures how well you are filling your lungs with each breath. Learning to take long, deep breaths using this tool can help you keep your lungs clear and active. This may help to reverse or lessen your chance of developing breathing (pulmonary) problems, especially infection. You may be asked to use a spirometer: After a surgery. If you have a lung problem or a history of smoking. After a long period of time when you have been unable to move or be active. If the spirometer includes an indicator to show the highest number that you  have reached, your health care provider or respiratory therapist will help you set a goal. Keep a log of your progress as told by your health care provider. What are the risks? Breathing too quickly may cause dizziness or cause you to pass out. Take your time so you do not get dizzy or light-headed. If you are in pain, you may need to take pain medicine before doing incentive spirometry. It is harder to take a deep breath if you are having pain. How to use your incentive spirometer  Sit up on the edge of your bed or on a chair. Hold the incentive spirometer so that it is in an upright  position. Before you use the spirometer, breathe out normally. Place the mouthpiece in your mouth. Make sure your lips are closed tightly around it. Breathe in slowly and as deeply as you can through your mouth, causing the piston or the ball to rise toward the top of the chamber. Hold your breath for 3-5 seconds, or for as long as possible. If the spirometer includes a coach indicator, use this to guide you in breathing. Slow down your breathing if the indicator goes above the marked areas. Remove the mouthpiece from your mouth and breathe out normally. The piston or ball will return to the bottom of the chamber. Rest for a few seconds, then repeat the steps 10 or more times. Take your time and take a few normal breaths between deep breaths so that you do not get dizzy or light-headed. Do this every 1-2 hours when you are awake. If the spirometer includes a goal marker to show the highest number you have reached (best effort), use this as a goal to work toward during each repetition. After each set of 10 deep breaths, cough a few times. This will help to make sure that your lungs are clear. If you have an incision on your chest or abdomen from surgery, place a pillow or a rolled-up towel firmly against the incision when you cough. This can help to reduce pain while taking deep breaths and coughing. General  tips When you are able to get out of bed: Walk around often. Continue to take deep breaths and cough in order to clear your lungs. Keep using the incentive spirometer until your health care provider says it is okay to stop using it. If you have been in the hospital, you may be told to keep using the spirometer at home. Contact a health care provider if: You are having difficulty using the spirometer. You have trouble using the spirometer as often as instructed. Your pain medicine is not giving enough relief for you to use the spirometer as told. You have a fever. Get help right away if: You develop shortness of breath. You develop a cough with bloody mucus from the lungs. You have fluid or blood coming from an incision site after you cough. Summary An incentive spirometer is a tool that can help you learn to take long, deep breaths to keep your lungs clear and active. You may be asked to use a spirometer after a surgery, if you have a lung problem or a history of smoking, or if you have been inactive for a long period of time. Use your incentive spirometer as instructed every 1-2 hours while you are awake. If you have an incision on your chest or abdomen, place a pillow or a rolled-up towel firmly against your incision when you cough. This will help to reduce pain. Get help right away if you have shortness of breath, you cough up bloody mucus, or blood comes from your incision when you cough. This information is not intended to replace advice given to you by your health care provider. Make sure you discuss any questions you have with your health care provider.    POLAR CARE INFORMATION  MassAdvertisement.it  How to use [facility] Polar Care [facility] Therapy System?  YouTube   ShippingScam.co.uk  OPERATING INSTRUCTIONS  Start the product With dry hands, connect the transformer to the electrical connection located on the top of the cooler. Next, plug the transformer into  an appropriate electrical outlet. The unit will automatically start running at  this point.  To stop the pump, disconnect electrical power.  Unplug to stop the product when not in use. Unplugging the Polar Care unit turns it off. Always unplug immediately after use. Never leave it plugged in while unattended. Remove pad.    FIRST ADD WATER TO FILL LINE, THEN ICE---Replace ice when existing ice is almost melted  1 Discuss Treatment with your Licensed Health Care Practitioner and Use Only as Prescribed 2 Apply Insulation Barrier & Cold Therapy Pad 3 Check for Moisture 4 Inspect Skin Regularly  Tips and Trouble Shooting Usage Tips 1. Use cubed or chunked ice for optimal performance. 2. It is recommended to drain the Pad between uses. To drain the pad, hold the Pad upright with the hose pointed toward the ground. Depress the black plunger and allow water to drain out. 3. You may disconnect the Pad from the unit without removing the pad from the affected area by depressing the silver tabs on the hose coupling and gently pulling the hoses apart. The Pad and unit will seal itself and will not leak. Note: Some dripping during release is normal. 4. DO NOT RUN PUMP WITHOUT WATER! The pump in this unit is designed to run with water. Running the unit without water will cause permanent damage to the pump. 5. Unplug unit before removing lid.  TROUBLESHOOTING GUIDE Pump not running, Water not flowing to the pad, Pad is not getting cold 1. Make sure the transformer is plugged into the wall outlet. 2. Confirm that the ice and water are filled to the indicated levels. 3. Make sure there are no kinks in the pad. 4. Gently pull on the blue tube to make sure the tube/pad junction is straight. 5. Remove the pad from the treatment site and ll it while the pad is lying at; then reapply. 6. Confirm that the pad couplings are securely attached to the unit. Listen for the double clicks (Figure 1) to confirm the pad  couplings are securely attached.  Leaks    Note: Some condensation on the lines, controller, and pads is unavoidable, especially in warmer climates. 1. If using a Breg Polar Care Cold Therapy unit with a detachable Cold Therapy Pad, and a leak exists (other than condensation on the lines) disconnect the pad couplings. Make sure the silver tabs on the couplings are depressed before reconnecting the pad to the pump hose; then confirm both sides of the coupling are properly clicked in. 2. If the coupling continues to leak or a leak is detected in the pad itself, stop using it and call [employer] Customer Care at [phone].  Cleaning After use, empty and dry the unit with a soft cloth. Warm water and mild detergent may be used occasionally to clean the pump and tubes.  WARNING: The Polar Care Cube can be cold enough to cause serious injury, including full skin necrosis. Follow these Operating Instructions, and carefully read the Product Insert (see pouch on side of unit) and the Cold Therapy Pad Fitting Instructions (provided with each Cold Therapy Pad) prior to use.

## 2022-11-03 MED ORDER — CHLORHEXIDINE GLUCONATE 0.12 % MT SOLN
15.0000 mL | Freq: Once | OROMUCOSAL | Status: AC
  Administered 2022-11-04: 15 mL via OROMUCOSAL

## 2022-11-03 MED ORDER — GABAPENTIN 300 MG PO CAPS
300.0000 mg | ORAL_CAPSULE | Freq: Once | ORAL | Status: AC
  Administered 2022-11-04: 300 mg via ORAL

## 2022-11-03 MED ORDER — CEFAZOLIN SODIUM-DEXTROSE 2-4 GM/100ML-% IV SOLN
2.0000 g | INTRAVENOUS | Status: AC
  Administered 2022-11-04: 2 g via INTRAVENOUS

## 2022-11-03 MED ORDER — CHLORHEXIDINE GLUCONATE 4 % EX SOLN: 60.0000 mL | Freq: Once | CUTANEOUS | Status: DC

## 2022-11-03 MED ORDER — TRANEXAMIC ACID-NACL 1000-0.7 MG/100ML-% IV SOLN
1000.0000 mg | INTRAVENOUS | Status: AC
  Administered 2022-11-04: 1000 mg via INTRAVENOUS

## 2022-11-03 MED ORDER — LACTATED RINGERS IV SOLN: INTRAVENOUS | Status: DC

## 2022-11-03 MED ORDER — DEXAMETHASONE SODIUM PHOSPHATE 10 MG/ML IJ SOLN
8.0000 mg | Freq: Once | INTRAMUSCULAR | Status: AC
  Administered 2022-11-04: 8 mg via INTRAVENOUS

## 2022-11-03 MED ORDER — ORAL CARE MOUTH RINSE: 15.0000 mL | Freq: Once | OROMUCOSAL | Status: AC

## 2022-11-03 MED ORDER — CELECOXIB 200 MG PO CAPS
400.0000 mg | ORAL_CAPSULE | Freq: Once | ORAL | Status: AC
  Administered 2022-11-04: 400 mg via ORAL

## 2022-11-03 NOTE — H&P (Signed)
ORTHOPAEDIC HISTORY & PHYSICAL Latanya Maudlin, PA - 10/27/2022 4:00 PM EDT Formatting of this note is different from the original. Images from the original note were not included. Chief Complaint Chief Complaint Patient presents with Knee Pain H & P RIGHT KNEE  Reason for Visit Scott Branch is a 87 y.o. who presents today for a history and physical. He is to undergo a right total knee arthroplasty on 11/04/2022. Since his last visit at the clinic there has been no change in his condition. The patient expresses his desire to proceed with surgery.  He has a long history of right knee pain. He underwent right knee arthroscopy, partial medial meniscectomy, and medial chondroplasty in August 2021. His symptoms have gradually progressed since that time. He localizes most of the pain along the medial aspect of the knee. He reports some swelling, no locking, and some giving way of the knee. The pain is aggravated by any weight bearing. The knee pain limits the patient's ability to ambulate long distances and has affected his ability to play golf. The patient has not appreciated any significant improvement despite Tylenol, intraarticular corticosteroid injections, viscosupplementation, and activity modification. He is not using any ambulatory aids. The patient states that the knee pain has progressed to the point that it is significantly interfering with his activities of daily living.  Patient also experiencing discomfort to the left knee as well.  Past Medical History Past Medical History: Diagnosis Date Asthma without status asthmaticus (HHS-HCC) Cancer (CMS/HHS-HCC) Cataract 01/04/2013 COPD (chronic obstructive pulmonary disease) (CMS/HHS-HCC) 01/04/2013 Hyperlipidemia 01/04/2013 Hypertension 01/04/2013 Osteoarthritis 01/04/2013  Past Surgical History Past Surgical History: Procedure Laterality Date EXTRACTION CATARACT EXTRACAPSULAR W/INSERTION INTRAOCULAR PROSTHESIS Right  01/16/2013 Procedure: LENSX/LRI+NANO ($600 + N/C)--R--EXTRACTION CATARACT EXTRACAPSULAR W/INSERTION INTRAOCULAR PROSTHESIS; Surgeon: Timoteo Ace, MD; Location: [facility] OR; Service: Ophthalmology; Laterality: Right; EXTRACTION CATARACT EXTRACAPSULAR W/INSERTION INTRAOCULAR PROSTHESIS Left 02/27/2013 Procedure: LENSX/LRI +NANO ($600 + N/C)--L--EXTRACTION CATARACT EXTRACAPSULAR W/INSERTION INTRAOCULAR PROSTHESIS; Surgeon: Timoteo Ace, MD; Location: [facility] OR; Service: Ophthalmology; Laterality: Left; Left knee arthroscopy, partial medial meniscetomy, and chondroplasty 08/27/2017 Dr Ernest Pine Limited arthroscopic debridement arthroscopic subacromial decompression mini-open rotator cuff repair incorporating a [employer] patch and mini0open boceps tenodesis,right shoulder Right 01/13/2018 Dr.Poggi Right knee arthroscopy, partial medial meniscectomy, and medial chondroplasty 08/28/2019 Dr Ernest Pine COLECTOMY PARTIAL W/ANASTAMOSIS cancer HERNIA REPAIR Bilateral PROSTATECTOMY RETROPUBIC cancer  Past Family History Family History Problem Relation Age of Onset No Known Problems Mother No Known Problems Father Blindness Neg Hx Cataracts Neg Hx Diabetes Neg Hx Glaucoma Neg Hx Macular degeneration Neg Hx  Medications Current Outpatient Medications Medication Sig Dispense Refill acetaminophen (TYLENOL) 650 MG ER tablet Take 1,300 mg by mouth 2 (two) times daily albuterol 90 mcg/actuation inhaler USE 2 INHALATIONS BY MOUTH INTO THE LUNGS EVERY 4 HOURS AS NEEDED FOR WHEEZING 51 g 3 aspirin 81 MG chewable tablet Take 162 mg by mouth once daily. cinnamon bark (CINNAMON ORAL) Take 1,000 mg by mouth once daily cyanocobalamin, vitamin B-12, 2,500 mcg Tab Take by mouth Take 2,500 mcg by mouth daily. doxazosin (CARDURA) 8 MG tablet TAKE 1 TABLET BY MOUTH ONCE DAILY 90 tablet 3 fluticasone propionate (FLONASE) 50 mcg/actuation nasal spray Place 2 sprays into both nostrils once  daily lisinopriL-hydroCHLOROthiazide (ZESTORETIC) 20-12.5 mg tablet TAKE 2 TABLETS BY MOUTH ONCE DAILY 180 tablet 3 peg 400-propylene glycol, PF, (SYSTANE ULTRA) 0.4-0.3 % ophthalmic drops Place 1 drop into both eyes as needed for Dry Eyes simvastatin (ZOCOR) 40 MG tablet TAKE 1 TABLET BY MOUTH ONCE DAILY  90 tablet 3 tiotropium-olodaterol (STIOLTO RESPIMAT) 2.5-2.5 mcg/actuation inhaler Inhale 2 inhalations into the lungs once daily traMADoL (ULTRAM) 50 mg tablet Take 1 tablet (50 mg total) by mouth every 6 (six) hours as needed for Pain 120 tablet 0 traZODone (DESYREL) 50 MG tablet Take 1 tablet (50 mg total) by mouth at bedtime 180 tablet 3  No current facility-administered medications for this visit.  Allergies No Known Allergies  Review of Systems A comprehensive 14 point ROS was performed, reviewed, and the pertinent orthopaedic findings are documented in the HPI.  Exam BP (!) 150/80 (BP Location: Left upper arm, Patient Position: Sitting, BP Cuff Size: Large Adult)  Ht 175.3 cm (5\' 9" )  Wt 95.4 kg (210 lb 6.4 oz)  BMI 31.07 kg/m  General: Well-developed well-nourished male seen in no acute distress.  HEENT: Atraumatic,normocephalic. Pupils are equal and reactive to light. Oropharynx is clear with moist mucosa  Lungs: Clear to auscultation bilaterally  Cardiovascular: Regular rate and rhythm. Normal S1, S2. No murmurs. No appreciable gallops or rubs. Peripheral pulses are palpable.  Abdomen: Soft, non-tender, nondistended. Bowel sounds present  Extremity: Right Knee: Soft tissue swelling: minimal Effusion: none Erythema: none Crepitance: mild Tenderness: medial Alignment: relative varus Mediolateral laxity: medial pseudolaxity Posterior sag: negative Patellar tracking: Good tracking without evidence of subluxation or tilt Atrophy: No significant atrophy. Quadriceps tone was fair to good. Range of motion: 0/0/129 degrees  Neurological:  The patient is alert  and oriented Sensation to light touch appears to be intact and within normal limits Gross motor strength appeared to be equal to 5/5  Vascular :  Peripheral pulses felt to be palpable. Capillary refill appears to be intact and within normal limits  X-ray  1. AP standing, lateral and sunrise view of the right knee taken on 09/01/2022 showed significant narrowing of the medial cartilage space near with bone-on-bone being noted. There is associated varus alignment noted. Some osteophyte and subchondral sclerosis was noted. No evidence of any fractures or dislocations.  Impression  1. Degenerative arthrosis right knee  Plan  1. I have gone with patient medication on today's visit 2. Past medical history reviewed 3. Postop rehab course was discussed 4. Return to clinic 2 weeks postop. Sooner if any problems  This note was generated in part with voice recognition software and I apologize for any typographical errors that were not detected and corrected   Tera Partridge PA Electronically signed by Latanya Maudlin, PA at 10/27/2022 4:08 PM EDT

## 2022-11-04 ENCOUNTER — Other Ambulatory Visit: Payer: Self-pay

## 2022-11-04 ENCOUNTER — Observation Stay: Payer: Medicare Other

## 2022-11-04 ENCOUNTER — Encounter
Admission: RE | Disposition: A | Discharge status: Home / Self Care | Payer: Self-pay | Source: Home / Self Care | Attending: Orthopedic Surgery

## 2022-11-04 ENCOUNTER — Encounter: Payer: Self-pay | Admitting: Orthopedic Surgery

## 2022-11-04 ENCOUNTER — Observation Stay
Admission: RE | Admit: 2022-11-04 | Discharge: 2022-11-05 | Disposition: A | Discharge status: Home / Self Care | Payer: Medicare Other | Attending: Orthopedic Surgery | Admitting: Orthopedic Surgery

## 2022-11-04 ENCOUNTER — Ambulatory Visit: Payer: Medicare Other | Admitting: Dermatology

## 2022-11-04 ENCOUNTER — Ambulatory Visit: Payer: Medicare Other | Admitting: Anesthesiology

## 2022-11-04 ENCOUNTER — Ambulatory Visit: Payer: Medicare Other | Admitting: Urgent Care

## 2022-11-04 DIAGNOSIS — Z79899 Other long term (current) drug therapy: Secondary | ICD-10-CM | POA: Insufficient documentation

## 2022-11-04 DIAGNOSIS — M1711 Unilateral primary osteoarthritis, right knee: Principal | ICD-10-CM | POA: Insufficient documentation

## 2022-11-04 DIAGNOSIS — J449 Chronic obstructive pulmonary disease, unspecified: Secondary | ICD-10-CM | POA: Insufficient documentation

## 2022-11-04 DIAGNOSIS — I10 Essential (primary) hypertension: Secondary | ICD-10-CM | POA: Diagnosis not present

## 2022-11-04 DIAGNOSIS — J45909 Unspecified asthma, uncomplicated: Secondary | ICD-10-CM | POA: Diagnosis not present

## 2022-11-04 DIAGNOSIS — Z96659 Presence of unspecified artificial knee joint: Secondary | ICD-10-CM

## 2022-11-04 DIAGNOSIS — Z7982 Long term (current) use of aspirin: Secondary | ICD-10-CM | POA: Diagnosis not present

## 2022-11-04 HISTORY — PX: KNEE ARTHROPLASTY: SHX992

## 2022-11-04 LAB — ABO/RH: ABO/RH(D): O POS

## 2022-11-04 SURGERY — ARTHROPLASTY, KNEE, TOTAL, USING IMAGELESS COMPUTER-ASSISTED NAVIGATION
Anesthesia: Spinal | Site: Knee | Laterality: Right

## 2022-11-04 MED ORDER — FENTANYL CITRATE (PF) 100 MCG/2ML IJ SOLN: 25.0000 ug | INTRAMUSCULAR | Status: DC | PRN

## 2022-11-04 MED ORDER — FAMOTIDINE 20 MG PO TABS
ORAL_TABLET | ORAL | Status: AC
  Filled 2022-11-04: qty 1

## 2022-11-04 MED ORDER — POLYVINYL ALCOHOL 1.4 % OP SOLN
1.0000 [drp] | Freq: Two times a day (BID) | OPHTHALMIC | Status: DC
  Administered 2022-11-04 – 2022-11-05 (×2): 1 [drp] via OPHTHALMIC
  Filled 2022-11-04: qty 15

## 2022-11-04 MED ORDER — DIPHENHYDRAMINE HCL 12.5 MG/5ML PO ELIX: 12.5000 mg | ORAL_SOLUTION | ORAL | Status: DC | PRN

## 2022-11-04 MED ORDER — PROPOFOL 10 MG/ML IV BOLUS
INTRAVENOUS | Status: DC | PRN
  Administered 2022-11-04: 20 mg via INTRAVENOUS

## 2022-11-04 MED ORDER — SODIUM CHLORIDE 0.9 % IV SOLN: INTRAVENOUS | Status: DC

## 2022-11-04 MED ORDER — CEFAZOLIN SODIUM-DEXTROSE 2-4 GM/100ML-% IV SOLN
INTRAVENOUS | Status: AC
  Filled 2022-11-04: qty 100

## 2022-11-04 MED ORDER — DOXAZOSIN MESYLATE 8 MG PO TABS
8.0000 mg | ORAL_TABLET | Freq: Every day | ORAL | Status: DC
  Filled 2022-11-04: qty 1

## 2022-11-04 MED ORDER — PHENYLEPHRINE HCL-NACL 20-0.9 MG/250ML-% IV SOLN
INTRAVENOUS | Status: DC | PRN
  Administered 2022-11-04: 30 ug/min via INTRAVENOUS

## 2022-11-04 MED ORDER — ACETAMINOPHEN 10 MG/ML IV SOLN
INTRAVENOUS | Status: AC
  Filled 2022-11-04: qty 100

## 2022-11-04 MED ORDER — CHLORHEXIDINE GLUCONATE 0.12 % MT SOLN
OROMUCOSAL | Status: AC
  Filled 2022-11-04: qty 15

## 2022-11-04 MED ORDER — PANTOPRAZOLE SODIUM 40 MG PO TBEC
40.0000 mg | DELAYED_RELEASE_TABLET | Freq: Two times a day (BID) | ORAL | Status: DC
  Administered 2022-11-04 – 2022-11-05 (×2): 40 mg via ORAL

## 2022-11-04 MED ORDER — ALBUTEROL SULFATE (2.5 MG/3ML) 0.083% IN NEBU: 3.0000 mL | INHALATION_SOLUTION | Freq: Four times a day (QID) | RESPIRATORY_TRACT | Status: DC | PRN

## 2022-11-04 MED ORDER — ALBUTEROL SULFATE (2.5 MG/3ML) 0.083% IN NEBU
INHALATION_SOLUTION | RESPIRATORY_TRACT | Status: AC
  Filled 2022-11-04: qty 3

## 2022-11-04 MED ORDER — TRANEXAMIC ACID-NACL 1000-0.7 MG/100ML-% IV SOLN
INTRAVENOUS | Status: AC
  Filled 2022-11-04: qty 100

## 2022-11-04 MED ORDER — OXYCODONE HCL 5 MG PO TABS
ORAL_TABLET | ORAL | Status: AC
  Filled 2022-11-04: qty 1

## 2022-11-04 MED ORDER — DROPERIDOL 2.5 MG/ML IJ SOLN: 0.6250 mg | Freq: Once | INTRAMUSCULAR | Status: DC | PRN

## 2022-11-04 MED ORDER — METOCLOPRAMIDE HCL 10 MG PO TABS
ORAL_TABLET | ORAL | Status: AC
  Filled 2022-11-04: qty 1

## 2022-11-04 MED ORDER — LISINOPRIL 20 MG PO TABS
ORAL_TABLET | ORAL | Status: AC
  Filled 2022-11-04: qty 1

## 2022-11-04 MED ORDER — PHENOL 1.4 % MT LIQD: 1.0000 | OROMUCOSAL | Status: DC | PRN

## 2022-11-04 MED ORDER — ONDANSETRON HCL 4 MG/2ML IJ SOLN: 4.0000 mg | Freq: Four times a day (QID) | INTRAMUSCULAR | Status: DC | PRN

## 2022-11-04 MED ORDER — PHENYLEPHRINE 80 MCG/ML (10ML) SYRINGE FOR IV PUSH (FOR BLOOD PRESSURE SUPPORT)
PREFILLED_SYRINGE | INTRAVENOUS | Status: DC | PRN
  Administered 2022-11-04: 80 ug via INTRAVENOUS

## 2022-11-04 MED ORDER — TRANEXAMIC ACID-NACL 1000-0.7 MG/100ML-% IV SOLN
1000.0000 mg | Freq: Once | INTRAVENOUS | Status: AC
  Administered 2022-11-04: 1000 mg via INTRAVENOUS

## 2022-11-04 MED ORDER — BISACODYL 10 MG RE SUPP: 10.0000 mg | Freq: Every day | RECTAL | Status: DC | PRN

## 2022-11-04 MED ORDER — ACETAMINOPHEN 325 MG PO TABS: 325.0000 mg | ORAL_TABLET | Freq: Four times a day (QID) | ORAL | Status: DC | PRN

## 2022-11-04 MED ORDER — FERROUS SULFATE 325 (65 FE) MG PO TABS
325.0000 mg | ORAL_TABLET | Freq: Two times a day (BID) | ORAL | Status: DC
  Administered 2022-11-05: 325 mg via ORAL

## 2022-11-04 MED ORDER — SODIUM CHLORIDE 0.9 % IR SOLN
Status: DC | PRN
  Administered 2022-11-04: 3000 mL

## 2022-11-04 MED ORDER — HYDROCHLOROTHIAZIDE 12.5 MG PO TABS
12.5000 mg | ORAL_TABLET | Freq: Every day | ORAL | Status: DC
  Administered 2022-11-04 – 2022-11-05 (×2): 12.5 mg via ORAL
  Filled 2022-11-04 (×2): qty 1

## 2022-11-04 MED ORDER — CELECOXIB 200 MG PO CAPS
200.0000 mg | ORAL_CAPSULE | Freq: Two times a day (BID) | ORAL | Status: DC
  Administered 2022-11-04 – 2022-11-05 (×2): 200 mg via ORAL
  Filled 2022-11-04: qty 1

## 2022-11-04 MED ORDER — BUPIVACAINE HCL (PF) 0.25 % IJ SOLN
INTRAMUSCULAR | Status: AC
  Filled 2022-11-04: qty 60

## 2022-11-04 MED ORDER — GABAPENTIN 300 MG PO CAPS
ORAL_CAPSULE | ORAL | Status: AC
  Filled 2022-11-04: qty 1

## 2022-11-04 MED ORDER — CEFAZOLIN SODIUM-DEXTROSE 2-4 GM/100ML-% IV SOLN
2.0000 g | Freq: Four times a day (QID) | INTRAVENOUS | Status: AC
  Administered 2022-11-04 – 2022-11-05 (×2): 2 g via INTRAVENOUS
  Filled 2022-11-04 (×3): qty 100

## 2022-11-04 MED ORDER — OXYCODONE HCL 5 MG/5ML PO SOLN: 5.0000 mg | Freq: Once | ORAL | Status: AC | PRN

## 2022-11-04 MED ORDER — LIDOCAINE HCL (CARDIAC) PF 100 MG/5ML IV SOSY
PREFILLED_SYRINGE | INTRAVENOUS | Status: DC | PRN
  Administered 2022-11-04: 30 mg via INTRAVENOUS

## 2022-11-04 MED ORDER — LISINOPRIL 20 MG PO TABS
20.0000 mg | ORAL_TABLET | Freq: Every day | ORAL | Status: DC
  Administered 2022-11-04 – 2022-11-05 (×2): 20 mg via ORAL

## 2022-11-04 MED ORDER — PROPOFOL 1000 MG/100ML IV EMUL
INTRAVENOUS | Status: AC
  Filled 2022-11-04: qty 100

## 2022-11-04 MED ORDER — LISINOPRIL-HYDROCHLOROTHIAZIDE 20-12.5 MG PO TABS: 1.0000 | ORAL_TABLET | Freq: Every day | ORAL | Status: DC

## 2022-11-04 MED ORDER — ACETAMINOPHEN 10 MG/ML IV SOLN
1000.0000 mg | Freq: Four times a day (QID) | INTRAVENOUS | Status: DC
  Administered 2022-11-04 – 2022-11-05 (×3): 1000 mg via INTRAVENOUS

## 2022-11-04 MED ORDER — PANTOPRAZOLE SODIUM 40 MG PO TBEC
DELAYED_RELEASE_TABLET | ORAL | Status: AC
  Filled 2022-11-04: qty 1

## 2022-11-04 MED ORDER — FENTANYL CITRATE (PF) 100 MCG/2ML IJ SOLN
INTRAMUSCULAR | Status: AC
  Filled 2022-11-04: qty 2

## 2022-11-04 MED ORDER — MAGNESIUM HYDROXIDE 400 MG/5ML PO SUSP
30.0000 mL | Freq: Every day | ORAL | Status: DC
  Administered 2022-11-04 – 2022-11-05 (×2): 30 mL via ORAL

## 2022-11-04 MED ORDER — ASPIRIN 81 MG PO CHEW
CHEWABLE_TABLET | ORAL | Status: AC
  Filled 2022-11-04: qty 1

## 2022-11-04 MED ORDER — PROPOFOL 500 MG/50ML IV EMUL
INTRAVENOUS | Status: DC | PRN
  Administered 2022-11-04: 80 ug/kg/min via INTRAVENOUS

## 2022-11-04 MED ORDER — MAGNESIUM HYDROXIDE 400 MG/5ML PO SUSP
ORAL | Status: AC
  Filled 2022-11-04: qty 30

## 2022-11-04 MED ORDER — SURGIPHOR WOUND IRRIGATION SYSTEM - OPTIME
TOPICAL | Status: DC | PRN
  Administered 2022-11-04: 450 mL via TOPICAL

## 2022-11-04 MED ORDER — BUPIVACAINE HCL (PF) 0.5 % IJ SOLN
INTRAMUSCULAR | Status: DC | PRN
  Administered 2022-11-04: 3 mL via INTRATHECAL

## 2022-11-04 MED ORDER — SENNOSIDES-DOCUSATE SODIUM 8.6-50 MG PO TABS
ORAL_TABLET | ORAL | Status: AC
  Filled 2022-11-04: qty 1

## 2022-11-04 MED ORDER — OXYCODONE HCL 5 MG PO TABS: 5.0000 mg | ORAL_TABLET | ORAL | Status: DC | PRN

## 2022-11-04 MED ORDER — SODIUM CHLORIDE FLUSH 0.9 % IV SOLN
INTRAVENOUS | Status: AC
  Filled 2022-11-04: qty 20

## 2022-11-04 MED ORDER — DEXAMETHASONE SODIUM PHOSPHATE 10 MG/ML IJ SOLN
INTRAMUSCULAR | Status: AC
  Filled 2022-11-04: qty 1

## 2022-11-04 MED ORDER — OXYCODONE HCL 5 MG PO TABS: 10.0000 mg | ORAL_TABLET | ORAL | Status: DC | PRN

## 2022-11-04 MED ORDER — METOCLOPRAMIDE HCL 10 MG PO TABS
10.0000 mg | ORAL_TABLET | Freq: Three times a day (TID) | ORAL | Status: DC
  Administered 2022-11-04 – 2022-11-05 (×2): 10 mg via ORAL

## 2022-11-04 MED ORDER — PHENYLEPHRINE HCL-NACL 20-0.9 MG/250ML-% IV SOLN
INTRAVENOUS | Status: AC
  Filled 2022-11-04: qty 250

## 2022-11-04 MED ORDER — DOXAZOSIN MESYLATE 4 MG PO TABS
8.0000 mg | ORAL_TABLET | Freq: Every day | ORAL | Status: DC
  Administered 2022-11-04: 8 mg via ORAL
  Filled 2022-11-04: qty 2

## 2022-11-04 MED ORDER — FLUTICASONE PROPIONATE 50 MCG/ACT NA SUSP
1.0000 | Freq: Every day | NASAL | Status: DC
  Filled 2022-11-04: qty 16

## 2022-11-04 MED ORDER — SIMVASTATIN 40 MG PO TABS
40.0000 mg | ORAL_TABLET | Freq: Every day | ORAL | Status: DC
  Administered 2022-11-04: 40 mg via ORAL
  Filled 2022-11-04: qty 1

## 2022-11-04 MED ORDER — ALBUTEROL SULFATE (2.5 MG/3ML) 0.083% IN NEBU
3.0000 mL | INHALATION_SOLUTION | Freq: Two times a day (BID) | RESPIRATORY_TRACT | Status: DC
  Administered 2022-11-04: 3 mL via RESPIRATORY_TRACT

## 2022-11-04 MED ORDER — ACETAMINOPHEN 10 MG/ML IV SOLN
INTRAVENOUS | Status: DC | PRN
  Administered 2022-11-04: 1000 mg via INTRAVENOUS

## 2022-11-04 MED ORDER — ALUM & MAG HYDROXIDE-SIMETH 200-200-20 MG/5ML PO SUSP: 30.0000 mL | ORAL | Status: DC | PRN

## 2022-11-04 MED ORDER — ONDANSETRON HCL 4 MG PO TABS: 4.0000 mg | ORAL_TABLET | Freq: Four times a day (QID) | ORAL | Status: DC | PRN

## 2022-11-04 MED ORDER — EPHEDRINE SULFATE (PRESSORS) 50 MG/ML IJ SOLN
INTRAMUSCULAR | Status: DC | PRN
  Administered 2022-11-04: 5 mg via INTRAVENOUS

## 2022-11-04 MED ORDER — FENTANYL CITRATE (PF) 100 MCG/2ML IJ SOLN
INTRAMUSCULAR | Status: DC | PRN
  Administered 2022-11-04: 50 ug via INTRAVENOUS
  Administered 2022-11-04 (×2): 25 ug via INTRAVENOUS

## 2022-11-04 MED ORDER — SODIUM CHLORIDE (PF) 0.9 % IJ SOLN
INTRAMUSCULAR | Status: DC | PRN
  Administered 2022-11-04: 120 mL via PERIARTICULAR

## 2022-11-04 MED ORDER — HYDRALAZINE HCL 20 MG/ML IJ SOLN
INTRAMUSCULAR | Status: DC | PRN
Start: 2022-11-04 — End: 2022-11-04
  Administered 2022-11-04: 5 mg via INTRAVENOUS

## 2022-11-04 MED ORDER — MENTHOL 3 MG MT LOZG: 1.0000 | LOZENGE | OROMUCOSAL | Status: DC | PRN

## 2022-11-04 MED ORDER — SODIUM CHLORIDE (PF) 0.9 % IJ SOLN: INTRAMUSCULAR | Status: DC | PRN

## 2022-11-04 MED ORDER — FLEET ENEMA RE ENEM: 1.0000 | ENEMA | Freq: Once | RECTAL | Status: DC | PRN

## 2022-11-04 MED ORDER — CELECOXIB 200 MG PO CAPS
ORAL_CAPSULE | ORAL | Status: AC
  Filled 2022-11-04: qty 2

## 2022-11-04 MED ORDER — TRAZODONE HCL 50 MG PO TABS
50.0000 mg | ORAL_TABLET | Freq: Every day | ORAL | Status: DC
  Administered 2022-11-04: 50 mg via ORAL
  Filled 2022-11-04: qty 1

## 2022-11-04 MED ORDER — SENNOSIDES-DOCUSATE SODIUM 8.6-50 MG PO TABS
1.0000 | ORAL_TABLET | Freq: Two times a day (BID) | ORAL | Status: DC
  Administered 2022-11-04 – 2022-11-05 (×2): 1 via ORAL

## 2022-11-04 MED ORDER — BUPIVACAINE LIPOSOME 1.3 % IJ SUSP
INTRAMUSCULAR | Status: AC
  Filled 2022-11-04: qty 20

## 2022-11-04 MED ORDER — EPHEDRINE 5 MG/ML INJ
INTRAVENOUS | Status: AC
  Filled 2022-11-04: qty 5

## 2022-11-04 MED ORDER — ARFORMOTEROL TARTRATE 15 MCG/2ML IN NEBU
15.0000 ug | INHALATION_SOLUTION | Freq: Two times a day (BID) | RESPIRATORY_TRACT | Status: DC
  Administered 2022-11-04: 15 ug via RESPIRATORY_TRACT
  Filled 2022-11-04 (×4): qty 2

## 2022-11-04 MED ORDER — UMECLIDINIUM BROMIDE 62.5 MCG/ACT IN AEPB
1.0000 | INHALATION_SPRAY | Freq: Every day | RESPIRATORY_TRACT | Status: DC
  Administered 2022-11-04 – 2022-11-05 (×2): 1 via RESPIRATORY_TRACT
  Filled 2022-11-04: qty 7

## 2022-11-04 MED ORDER — ASPIRIN 81 MG PO CHEW
81.0000 mg | CHEWABLE_TABLET | Freq: Two times a day (BID) | ORAL | Status: DC
  Administered 2022-11-04 – 2022-11-05 (×2): 81 mg via ORAL

## 2022-11-04 MED ORDER — OXYCODONE HCL 5 MG PO TABS
5.0000 mg | ORAL_TABLET | Freq: Once | ORAL | Status: AC | PRN
  Administered 2022-11-04: 5 mg via ORAL

## 2022-11-04 MED ORDER — ACETAMINOPHEN 10 MG/ML IV SOLN: 1000.0000 mg | Freq: Once | INTRAVENOUS | Status: DC | PRN

## 2022-11-04 MED ORDER — TRAMADOL HCL 50 MG PO TABS: 50.0000 mg | ORAL_TABLET | ORAL | Status: DC | PRN

## 2022-11-04 MED ORDER — ENSURE PRE-SURGERY PO LIQD
296.0000 mL | Freq: Once | ORAL | Status: AC
  Administered 2022-11-04: 296 mL via ORAL
  Filled 2022-11-04: qty 296

## 2022-11-04 MED ORDER — HYDROMORPHONE HCL 1 MG/ML IJ SOLN: 0.5000 mg | INTRAMUSCULAR | Status: DC | PRN

## 2022-11-04 SURGICAL SUPPLY — 75 items
ATTUNE MED DOME PAT 41 KNEE (Knees) IMPLANT
ATTUNE PS FEM RT SZ 7 CEM KNEE (Femur) IMPLANT
ATTUNE PSRP INSR SZ7 5 KNEE (Insert) IMPLANT
BASE TIBIAL ROT PLAT SZ 7 KNEE (Knees) IMPLANT
BATTERY INSTRU NAVIGATION (MISCELLANEOUS) ×4 IMPLANT
BIT DRILL QUICK REL 1/8 2PK SL (BIT) ×1 IMPLANT
BLADE SAW 70X12.5 (BLADE) ×1 IMPLANT
BLADE SAW 90X13X1.19 OSCILLAT (BLADE) ×1 IMPLANT
BLADE SAW 90X25X1.19 OSCILLAT (BLADE) ×1 IMPLANT
BONE CEMENT GENTAMICIN (Cement) ×2 IMPLANT
BRUSH SCRUB EZ PLAIN DRY (MISCELLANEOUS) ×1 IMPLANT
BSPLAT TIB 7 CMNT ROT PLAT STR (Knees) ×1 IMPLANT
BTRY SRG DRVR LF (MISCELLANEOUS) ×4
CEMENT BONE GENTAMICIN 40 (Cement) IMPLANT
COOLER POLAR GLACIER W/PUMP (MISCELLANEOUS) ×1 IMPLANT
CUFF TOURN SGL QUICK 24 (TOURNIQUET CUFF)
CUFF TOURN SGL QUICK 30 (TOURNIQUET CUFF)
CUFF TRNQT CYL 24X4X16.5-23 (TOURNIQUET CUFF) IMPLANT
CUFF TRNQT CYL 30X4X21-28X (TOURNIQUET CUFF) IMPLANT
DRAPE INCISE IOBAN 66X45 STRL (DRAPES) IMPLANT
DRAPE SHEET LG 3/4 BI-LAMINATE (DRAPES) ×1 IMPLANT
DRSG AQUACEL AG ADV 3.5X14 (GAUZE/BANDAGES/DRESSINGS) ×1 IMPLANT
DRSG MEPILEX SACRM 8.7X9.8 (GAUZE/BANDAGES/DRESSINGS) ×1 IMPLANT
DRSG TEGADERM 4X4.75 (GAUZE/BANDAGES/DRESSINGS) ×1 IMPLANT
DURAPREP 26ML APPLICATOR (WOUND CARE) ×2 IMPLANT
ELECT CAUTERY BLADE 6.4 (BLADE) ×1 IMPLANT
ELECT REM PT RETURN 9FT ADLT (ELECTROSURGICAL) ×1
ELECTRODE REM PT RTRN 9FT ADLT (ELECTROSURGICAL) ×1 IMPLANT
EVACUATOR 1/8 PVC DRAIN (DRAIN) ×1 IMPLANT
EX-PIN ORTHOLOCK NAV 4X150 (PIN) ×2 IMPLANT
GAUZE XEROFORM 1X8 LF (GAUZE/BANDAGES/DRESSINGS) ×1 IMPLANT
GLOVE BIOGEL M STRL SZ7.5 (GLOVE) ×4 IMPLANT
GLOVE SRG 8 PF TXTR STRL LF DI (GLOVE) ×2 IMPLANT
GLOVE SURG UNDER POLY LF SZ8 (GLOVE) ×2
GOWN STRL REUS W/ TWL LRG LVL3 (GOWN DISPOSABLE) ×1 IMPLANT
GOWN STRL REUS W/ TWL XL LVL3 (GOWN DISPOSABLE) ×1 IMPLANT
GOWN STRL REUS W/TWL LRG LVL3 (GOWN DISPOSABLE) ×1
GOWN STRL REUS W/TWL XL LVL3 (GOWN DISPOSABLE) ×1
GOWN TOGA ZIPPER T7+ PEEL AWAY (MISCELLANEOUS) ×1 IMPLANT
HANDLE YANKAUER SUCT OPEN TIP (MISCELLANEOUS) ×1 IMPLANT
HOLDER FOLEY CATH W/STRAP (MISCELLANEOUS) ×1 IMPLANT
HOOD PEEL AWAY T7 (MISCELLANEOUS) ×1 IMPLANT
IV NS IRRIG 3000ML ARTHROMATIC (IV SOLUTION) ×1 IMPLANT
KIT TURNOVER KIT A (KITS) ×1 IMPLANT
KNIFE SCULPS 14X20 (INSTRUMENTS) ×1 IMPLANT
MANIFOLD NEPTUNE II (INSTRUMENTS) ×2 IMPLANT
NDL SPNL 20GX3.5 QUINCKE YW (NEEDLE) ×2 IMPLANT
NEEDLE SPNL 20GX3.5 QUINCKE YW (NEEDLE) ×2 IMPLANT
PACK TOTAL KNEE (MISCELLANEOUS) ×1 IMPLANT
PAD ABD DERMACEA PRESS 5X9 (GAUZE/BANDAGES/DRESSINGS) ×2 IMPLANT
PAD ARMBOARD 7.5X6 YLW CONV (MISCELLANEOUS) ×3 IMPLANT
PAD WRAPON POLAR KNEE (MISCELLANEOUS) ×1 IMPLANT
PENCIL SMOKE EVACUATOR COATED (MISCELLANEOUS) ×1 IMPLANT
PIN DRILL FIX HALF THREAD (BIT) ×2 IMPLANT
PIN FIXATION 1/8DIA X 3INL (PIN) ×1 IMPLANT
PULSAVAC PLUS IRRIG FAN TIP (DISPOSABLE) ×1
SOLUTION IRRIG SURGIPHOR (IV SOLUTION) ×1 IMPLANT
SPONGE DRAIN TRACH 4X4 STRL 2S (GAUZE/BANDAGES/DRESSINGS) ×1 IMPLANT
STAPLER SKIN PROX 35W (STAPLE) ×1 IMPLANT
STOCKINETTE IMPERV 14X48 (MISCELLANEOUS) ×1 IMPLANT
STRAP TIBIA SHORT (MISCELLANEOUS) ×1 IMPLANT
SUCTION TUBE FRAZIER 10FR DISP (SUCTIONS) ×1 IMPLANT
SUT VIC AB 0 CT1 36 (SUTURE) ×1 IMPLANT
SUT VIC AB 1 CT1 36 (SUTURE) ×2 IMPLANT
SUT VIC AB 2-0 CT2 27 (SUTURE) ×1 IMPLANT
SYR 30ML LL (SYRINGE) ×2 IMPLANT
TIBIAL BASE ROT PLAT SZ 7 KNEE (Knees) ×1 IMPLANT
TIP FAN IRRIG PULSAVAC PLUS (DISPOSABLE) ×1 IMPLANT
TOWEL OR 17X26 4PK STRL BLUE (TOWEL DISPOSABLE) IMPLANT
TOWER CARTRIDGE SMART MIX (DISPOSABLE) ×1 IMPLANT
TRAP FLUID SMOKE EVACUATOR (MISCELLANEOUS) ×1 IMPLANT
TRAY FOLEY MTR SLVR 16FR STAT (SET/KITS/TRAYS/PACK) ×1 IMPLANT
TUBING CONNECTING 10 (TUBING) ×2 IMPLANT
WATER STERILE IRR 1000ML POUR (IV SOLUTION) ×1 IMPLANT
WRAPON POLAR PAD KNEE (MISCELLANEOUS) ×1

## 2022-11-04 NOTE — Progress Notes (Signed)
Patient is not able to walk the distance required to go the bathroom, or he/she is unable to safely negotiate stairs required to access the bathroom.  A 3in1 BSC will alleviate this problem   Scott Branch, Jr. M.D.  

## 2022-11-04 NOTE — Interval H&P Note (Signed)
History and Physical Interval Note:  11/04/2022 11:15 AM  Scott Branch  has presented today for surgery, with the diagnosis of PRIMARY OSTEOARTHRITIS OF RIGHT KNEE..  The various methods of treatment have been discussed with the patient and family. After consideration of risks, benefits and other options for treatment, the patient has consented to  Procedure(s): COMPUTER ASSISTED TOTAL KNEE ARTHROPLASTY (Right) as a surgical intervention.  The patient's history has been reviewed, patient examined, no change in status, stable for surgery.  I have reviewed the patient's chart and labs.  Questions were answered to the patient's satisfaction.     Taunya Goral P Garnett Nunziata

## 2022-11-04 NOTE — Anesthesia Procedure Notes (Signed)
Spinal  Patient location during procedure: OR Start time: 11/04/2022 11:51 AM End time: 11/04/2022 11:52 AM Reason for block: surgical anesthesia Staffing Performed: resident/CRNA  Resident/CRNA: Monico Hoar, CRNA Performed by: Monico Hoar, CRNA Authorized by: Yevette Edwards, MD   Preanesthetic Checklist Completed: patient identified, IV checked, site marked, risks and benefits discussed, surgical consent, monitors and equipment checked, pre-op evaluation and timeout performed Spinal Block Patient position: sitting Prep: ChloraPrep Patient monitoring: heart rate, continuous pulse ox, blood pressure and cardiac monitor Approach: midline Location: L4-5 Injection technique: single-shot Needle Needle type: Introducer and Pencan  Needle gauge: 24 G Needle length: 10 cm Assessment Sensory level: T6 Events: CSF return Additional Notes Negative paresthesia. Negative blood return. Positive free-flowing CSF. Expiration date of kit checked and confirmed. Patient tolerated procedure well, without complications.

## 2022-11-04 NOTE — Anesthesia Preprocedure Evaluation (Signed)
Anesthesia Evaluation  Patient identified by MRN, date of birth, ID band Patient awake    Reviewed: Allergy & Precautions, H&P , NPO status , Patient's Chart, lab work & pertinent test results, reviewed documented beta blocker date and time   Airway Mallampati: II   Neck ROM: full    Dental  (+) Poor Dentition   Pulmonary shortness of breath and with exertion, asthma , pneumonia, resolved, COPD,  COPD inhaler, former smoker   Pulmonary exam normal        Cardiovascular Exercise Tolerance: Poor hypertension, On Medications negative cardio ROS Normal cardiovascular exam Rhythm:regular Rate:Normal     Neuro/Psych negative neurological ROS  negative psych ROS   GI/Hepatic negative GI ROS, Neg liver ROS,,,  Endo/Other  diabetes, Well Controlled, Type 2    Renal/GU CRFRenal disease  negative genitourinary   Musculoskeletal   Abdominal   Peds  Hematology negative hematology ROS (+)   Anesthesia Other Findings Past Medical History: No date: Arthritis No date: Asthma No date: Chronic kidney disease     Comment:  STAGE 3 1982: Colon cancer (HCC)     Comment:  Partial Colon resection No date: COPD (chronic obstructive pulmonary disease) (HCC)     Comment:  CHRONIC BRONCHITIS No date: Cough     Comment:  CHRONIC BRONCHITIS No date: Dyspnea No date: Hypertension No date: Pneumonia 2000: Prostate cancer (HCC)     Comment:  Prostatectomy Past Surgical History: 2020: BLEPHAROPLASTY; Bilateral No date: COLON SURGERY No date: EYE SURGERY     Comment:  Bilateral cataract surgery. No date: HERNIA REPAIR     Comment:  X 2 INGUINAL 08/27/2017: KNEE ARTHROSCOPY; Left     Comment:  Procedure: ARTHROSCOPY KNEE  PARTIAL MEDIAL MENISECTOMY               CHONDROPLASTY;  Surgeon: Donato Heinz, MD;  Location:               [facility];  Service: Orthopedics;  Laterality: Left; 08/28/2019: KNEE ARTHROSCOPY; Right     Comment:   Procedure: ARTHROSCOPY KNEE;  Surgeon: Donato Heinz,               MD;  Location: [facility] OR[facility];  Service: Orthopedics;                Laterality: Right; No date: PROSTATECTOMY 01/13/2018: SHOULDER ARTHROSCOPY WITH OPEN ROTATOR CUFF REPAIR; Right     Comment:  Procedure: SHOULDER ARTHROSCOPY WITH OPEN ROTATOR CUFF               REPAIR;  Surgeon: Christena Flake, MD;  Location: [facility] OR[facility];              Service: Orthopedics;  Laterality: Right; BMI    Body Mass Index: 30.11 kg/m     Reproductive/Obstetrics negative OB ROS                             Anesthesia Physical Anesthesia Plan  ASA: 3  Anesthesia Plan: Spinal   Post-op Pain Management:    Induction:   PONV Risk Score and Plan: 3  Airway Management Planned:   Additional Equipment:   Intra-op Plan:   Post-operative Plan:   Informed Consent: I have reviewed the patients History and Physical, chart, labs and discussed the procedure including the risks, benefits and alternatives for the proposed anesthesia with the patient or authorized representative who has indicated his/her understanding and acceptance.  Dental Advisory Given  Plan Discussed with: CRNA  Anesthesia Plan Comments:        Anesthesia Quick Evaluation

## 2022-11-04 NOTE — Transfer of Care (Signed)
Immediate Anesthesia Transfer of Care Note  Patient: Scott Branch  Procedure(s) Performed: COMPUTER ASSISTED TOTAL KNEE ARTHROPLASTY (Right: Knee)  Patient Location: PACU  Anesthesia Type:Spinal  Level of Consciousness: awake and alert   Airway & Oxygen Therapy: Patient Spontanous Breathing and Patient connected to face mask oxygen  Post-op Assessment: Report given to RN and Post -op Vital signs reviewed and stable  Post vital signs: Reviewed and stable  Last Vitals:  Vitals Value Taken Time  BP 108/50 11/04/22 1501  Temp    Pulse 92 11/04/22 1503  Resp 18 11/04/22 1503  SpO2 99 % 11/04/22 1503  Vitals shown include unfiled device data.  Last Pain:  Vitals:   11/04/22 0914  TempSrc: Temporal  PainSc: 0-No pain      Patients Stated Pain Goal: 0 (11/04/22 0914)  Complications: No notable events documented.

## 2022-11-04 NOTE — Op Note (Signed)
OPERATIVE NOTE  DATE OF SURGERY:  11/04/2022  PATIENT NAME:  DAQUON GREENLEAF   DOB: 1935-06-23  MRN: [id number]  PRE-OPERATIVE DIAGNOSIS: Degenerative arthrosis of the right knee, primary  POST-OPERATIVE DIAGNOSIS:  Same  PROCEDURE:  Right total knee arthroplasty using computer-assisted navigation  SURGEON:  Jena Gauss. M.D.  ASSISTANT:  Gean Birchwood, PA-C (present and scrubbed throughout the case, critical for assistance with exposure, retraction, instrumentation, and closure)  ANESTHESIA: spinal  ESTIMATED BLOOD LOSS: 50 mL  FLUIDS REPLACED: 700 mL of crystalloid  TOURNIQUET TIME: 80 minutes  DRAINS: 2 medium Hemovac drains  SOFT TISSUE RELEASES: Anterior cruciate ligament, posterior cruciate ligament, deep medial collateral ligament, patellofemoral ligament  IMPLANTS UTILIZED: DePuy Attune size 7 posterior stabilized femoral component (cemented), size 7 rotating platform tibial component (cemented), 41 mm medialized dome patella (cemented), and a 5 mm stabilized rotating platform polyethylene insert.  INDICATIONS FOR SURGERY: JOSEDE CICERO is a 87 y.o. year old adult with a long history of progressive knee pain. X-rays demonstrated severe degenerative changes in tricompartmental fashion. The patient had not seen any significant improvement despite conservative nonsurgical intervention. After discussion of the risks and benefits of surgical intervention, the patient expressed understanding of the risks benefits and agree with plans for total knee arthroplasty.   The risks, benefits, and alternatives were discussed at length including but not limited to the risks of infection, bleeding, nerve injury, stiffness, blood clots, the need for revision surgery, cardiopulmonary complications, among others, and they were willing to proceed.  PROCEDURE IN DETAIL: The patient was brought into the operating room and, after adequate spinal anesthesia was achieved, a tourniquet  was placed on the patient's upper thigh. The patient's knee and leg were cleaned and prepped with alcohol and DuraPrep and draped in the usual sterile fashion. A "timeout" was performed as per usual protocol. The lower extremity was exsanguinated using an Esmarch, and the tourniquet was inflated to 300 mmHg. An anterior longitudinal incision was made followed by a standard mid vastus approach. The deep fibers of the medial collateral ligament were elevated in a subperiosteal fashion off of the medial flare of the tibia so as to maintain a continuous soft tissue sleeve. The patella was subluxed laterally and the patellofemoral ligament was incised. Inspection of the knee demonstrated severe degenerative changes with full-thickness loss of articular cartilage. Osteophytes were debrided using a rongeur. Anterior and posterior cruciate ligaments were excised. Two 4.0 mm Schanz pins were inserted in the femur and into the tibia for attachment of the array of trackers used for computer-assisted navigation. Hip center was identified using a circumduction technique. Distal landmarks were mapped using the computer. The distal femur and proximal tibia were mapped using the computer. The distal femoral cutting guide was positioned using computer-assisted navigation so as to achieve a 5 distal valgus cut. The femur was sized and it was felt that a size 7 femoral component was appropriate. A size 7 femoral cutting guide was positioned and the anterior cut was performed and verified using the computer. This was followed by completion of the posterior and chamfer cuts. Femoral cutting guide for the central box was then positioned in the center box cut was performed.  Attention was then directed to the proximal tibia. Medial and lateral menisci were excised. The extramedullary tibial cutting guide was positioned using computer-assisted navigation so as to achieve a 0 varus-valgus alignment and 3 posterior slope. The cut was  performed and verified using the computer. The proximal tibia  was sized and it was felt that a size 7 tibial tray was appropriate. Tibial and femoral trials were inserted followed by insertion of a 5 mm polyethylene insert. This allowed for excellent mediolateral soft tissue balancing both in flexion and in full extension. Finally, the patella was cut and prepared so as to accommodate a 41 mm medialized dome patella. A patella trial was placed and the knee was placed through a range of motion with excellent patellar tracking appreciated. The femoral trial was removed after debridement of posterior osteophytes. The central post-hole for the tibial component was reamed followed by insertion of a keel punch. Tibial trials were then removed. Cut surfaces of bone were irrigated with copious amounts of normal saline using pulsatile lavage and then suctioned dry. Polymethylmethacrylate cement with gentamicin was prepared in the usual fashion using a vacuum mixer. Cement was applied to the cut surface of the proximal tibia as well as along the undersurface of a size 7 rotating platform tibial component. Tibial component was positioned and impacted into place. Excess cement was removed using Personal assistant. Cement was then applied to the cut surfaces of the femur as well as along the posterior flanges of the size 7 femoral component. The femoral component was positioned and impacted into place. Excess cement was removed using Personal assistant. A 5 mm polyethylene trial was inserted and the knee was brought into full extension with steady axial compression applied. Finally, cement was applied to the backside of a 41 mm medialized dome patella and the patellar component was positioned and patellar clamp applied. Excess cement was removed using Personal assistant. After adequate curing of the cement, the tourniquet was deflated after a total tourniquet time of 80 minutes. Hemostasis was achieved using electrocautery. The knee was  irrigated with copious amounts of normal saline using pulsatile lavage followed by 450 ml of Surgiphor and then suctioned dry. 20 mL of 1.3% Exparel and 60 mL of 0.25% Marcaine in 40 mL of normal saline was injected along the posterior capsule, medial and lateral gutters, and along the arthrotomy site. A 5 mm stabilized rotating platform polyethylene insert was inserted and the knee was placed through a range of motion with excellent mediolateral soft tissue balancing appreciated and excellent patellar tracking noted. 2 medium drains were placed in the wound bed and brought out through separate stab incisions. The medial parapatellar portion of the incision was reapproximated using interrupted sutures of #1 Vicryl. Subcutaneous tissue was approximated in layers using first #0 Vicryl followed #2-0 Vicryl. The skin was approximated with skin staples. A sterile dressing was applied.  The patient tolerated the procedure well and was transported to the recovery room in stable condition.    Jonie Burdell P. Angie Fava., M.D.

## 2022-11-04 NOTE — Progress Notes (Signed)
PT Cancellation Note  Patient Details Name: Scott Branch MRN: [id number] DOB: 06-02-1935   Cancelled Treatment:    Reason Eval/Treat Not Completed: Patient not medically ready: Nursing requested PT hold this date secondary to insufficient return of LE strength/sensation.  Will attempt to see pt at a future date/time as medically appropriate.     Ovidio Hanger PT, DPT 11/04/22, 4:33 PM

## 2022-11-05 ENCOUNTER — Encounter: Payer: Self-pay | Admitting: Orthopedic Surgery

## 2022-11-05 DIAGNOSIS — M1711 Unilateral primary osteoarthritis, right knee: Secondary | ICD-10-CM | POA: Diagnosis not present

## 2022-11-05 MED ORDER — ASPIRIN 81 MG PO CHEW
CHEWABLE_TABLET | ORAL | Status: AC
  Filled 2022-11-05: qty 1

## 2022-11-05 MED ORDER — METOCLOPRAMIDE HCL 10 MG PO TABS
ORAL_TABLET | ORAL | Status: AC
  Filled 2022-11-05: qty 1

## 2022-11-05 MED ORDER — MAGNESIUM HYDROXIDE 400 MG/5ML PO SUSP
ORAL | Status: AC
  Filled 2022-11-05: qty 30

## 2022-11-05 MED ORDER — PANTOPRAZOLE SODIUM 40 MG PO TBEC
DELAYED_RELEASE_TABLET | ORAL | Status: AC
  Filled 2022-11-05: qty 1

## 2022-11-05 MED ORDER — ASPIRIN EC 81 MG PO TBEC: 162.0000 mg | DELAYED_RELEASE_TABLET | Freq: Two times a day (BID) | ORAL | Status: AC

## 2022-11-05 MED ORDER — HYDROCHLOROTHIAZIDE 25 MG PO TABS
ORAL_TABLET | ORAL | Status: AC
  Filled 2022-11-05: qty 1

## 2022-11-05 MED ORDER — ACETAMINOPHEN 10 MG/ML IV SOLN
INTRAVENOUS | Status: AC
  Filled 2022-11-05: qty 100

## 2022-11-05 MED ORDER — LISINOPRIL 20 MG PO TABS
ORAL_TABLET | ORAL | Status: AC
  Filled 2022-11-05: qty 1

## 2022-11-05 MED ORDER — SENNOSIDES-DOCUSATE SODIUM 8.6-50 MG PO TABS
ORAL_TABLET | ORAL | Status: AC
  Filled 2022-11-05: qty 1

## 2022-11-05 MED ORDER — FERROUS SULFATE 325 (65 FE) MG PO TABS
ORAL_TABLET | ORAL | Status: AC
  Filled 2022-11-05: qty 1

## 2022-11-05 MED ORDER — CELECOXIB 200 MG PO CAPS
ORAL_CAPSULE | ORAL | Status: AC
  Filled 2022-11-05: qty 1

## 2022-11-05 MED ORDER — TRAMADOL HCL 50 MG PO TABS: 50.0000 mg | ORAL_TABLET | ORAL | 0 refills | Status: AC | PRN

## 2022-11-05 MED ORDER — CELECOXIB 200 MG PO CAPS: 200.0000 mg | ORAL_CAPSULE | Freq: Two times a day (BID) | ORAL | 1 refills | Status: AC

## 2022-11-05 MED ORDER — OXYCODONE HCL 5 MG PO TABS: 5.0000 mg | ORAL_TABLET | ORAL | 0 refills | Status: AC | PRN

## 2022-11-05 MED ORDER — CEFAZOLIN SODIUM-DEXTROSE 2-4 GM/100ML-% IV SOLN
INTRAVENOUS | Status: AC
  Filled 2022-11-05: qty 100

## 2022-11-05 NOTE — Plan of Care (Signed)
  Problem: Activity: Goal: Ability to avoid complications of mobility impairment will improve Outcome: Progressing Goal: Range of joint motion will improve Outcome: Progressing   Problem: Pain Management: Goal: Pain level will decrease with appropriate interventions Outcome: Progressing   

## 2022-11-05 NOTE — Anesthesia Postprocedure Evaluation (Signed)
Anesthesia Post Note  Patient: MOHAMEDAMIN NIFONG  Procedure(s) Performed: COMPUTER ASSISTED TOTAL KNEE ARTHROPLASTY (Right: Knee)  Patient location during evaluation: PACU Anesthesia Type: Spinal Level of consciousness: awake and alert Pain management: pain level controlled Vital Signs Assessment: post-procedure vital signs reviewed and stable Respiratory status: spontaneous breathing, nonlabored ventilation, respiratory function stable and patient connected to nasal cannula oxygen Cardiovascular status: blood pressure returned to baseline and stable Postop Assessment: no apparent nausea or vomiting Anesthetic complications: no   No notable events documented.   Last Vitals:  Vitals:   11/05/22 0204 11/05/22 0600  BP: 129/62 (!) 125/58  Pulse: 79 74  Resp: 16 16  Temp: 36.7 C 36.8 C  SpO2: 100% 97%    Last Pain:  Vitals:   11/05/22 0600  TempSrc: Tympanic  PainSc: 2                  Yevette Edwards

## 2022-11-05 NOTE — Plan of Care (Signed)
  Problem: Pain Management: Goal: Pain level will decrease with appropriate interventions Outcome: Progressing   Problem: Skin Integrity: Goal: Will show signs of wound healing Outcome: Progressing   

## 2022-11-05 NOTE — TOC Progression Note (Signed)
Transition of Care [facility]) - Progression Note    Patient Details  Name: SCHON ZEIDERS MRN: [id number] Date of Birth: 1935-08-19  [facility] CM/SW Contact  Marlowe Sax, RN Phone Number: 11/05/2022, 9:46 AM  Clinical Narrative:    [facility] set up for [facility] prior to surgery by surgeons office, RW to be delivered by [employer] to the bedside   Expected Discharge Plan: Home w Home Health Services Barriers to Discharge: No Barriers Identified  Expected Discharge Plan and Services   Discharge Planning Services: CM Consult     Expected Discharge Date: 11/05/22               DME Arranged: Dan Humphreys rolling DME Agency: [facility] Date DME Agency Contacted: 11/05/22 Time DME Agency Contacted: [phone] Representative spoke with at DME Agency: Osvaldo Angst Arranged: PT HH Agency: [facility] Date [facility] Agency Contacted: 11/05/22 Time HH Agency Contacted: 0945 Representative spoke with at [facility] Agency: [place]   Social Determinants of Health (SDOH) Interventions SDOH Screenings   Food Insecurity: No Food Insecurity (11/04/2022)  Housing: Low Risk  (11/04/2022)  Transportation Needs: No Transportation Needs (11/04/2022)  Utilities: Not At Risk (11/04/2022)  Financial Resource Strain: Low Risk  (08/12/2022)   Received from [facility]  Tobacco Use: Medium Risk (11/04/2022)    Readmission Risk Interventions     No data to display

## 2022-11-05 NOTE — Discharge Summary (Signed)
Physician Discharge Summary  Subjective: 1 Day Post-Op Procedure(s) (LRB): COMPUTER ASSISTED TOTAL KNEE ARTHROPLASTY (Right) Patient reports pain as 0 on 0-10 scale.   Patient seen in rounds with Dr. Ernest Pine. Patient is well, and has had no acute complaints or problems. Denies any CP, SOB, N/V, fevers or chills He has worked with physical therapy and passed all of his PT protocols. Patient is ready to go home  Physician Discharge Summary  Patient ID: Scott Branch MRN: [id number] DOB/AGE: Apr 01, 1935 87 y.o.  Admit date: 11/04/2022 Discharge date: 11/05/2022  Admission Diagnoses:  Discharge Diagnoses:  Principal Problem:   Total knee replacement status   Discharged Condition: good  Hospital Course: Patient presented to the hospital on 11/04/2022 for an elective right total knee arthroplasty performed by Dr. Ernest Pine.  Patient was given 1 g of TXA and 2 g of Ancef perioperatively.  He tolerated the procedure well without any complications.  See operative details below.  Postoperatively, the patient did very well, his drain was pulled on postop day 1 without issue, intact.  He was able to pass his PT protocols on postop day 1.  His vital signs are stable, denies any CP, SOB, N/B, fevers or chills.  Patient is stable for discharge  PROCEDURE:  Right total knee arthroplasty using computer-assisted navigation   SURGEON:  Scott Branch. M.D.   ASSISTANT:  Scott Birchwood, PA-C (present and scrubbed throughout the case, critical for assistance with exposure, retraction, instrumentation, and closure)   ANESTHESIA: spinal   ESTIMATED BLOOD LOSS: 50 mL   FLUIDS REPLACED: 700 mL of crystalloid   TOURNIQUET TIME: 80 minutes   DRAINS: 2 medium Hemovac drains   SOFT TISSUE RELEASES: Anterior cruciate ligament, posterior cruciate ligament, deep medial collateral ligament, patellofemoral ligament   IMPLANTS UTILIZED: DePuy Attune size 7 posterior stabilized femoral component  (cemented), size 7 rotating platform tibial component (cemented), 41 mm medialized dome patella (cemented), and a 5 mm stabilized rotating platform polyethylene insert.    Treatments: None  Discharge Exam: Blood pressure 121/66, pulse 60, temperature 97.9 F (36.6 C), temperature source Oral, resp. rate 16, height 5\' 10"  (1.778 m), weight 95.2 kg, SpO2 96%.   Disposition: Home   Allergies as of 11/05/2022   No Known Allergies      Medication List     TAKE these medications    acetaminophen 650 MG CR tablet Commonly known as: TYLENOL Take 1,300 mg by mouth in the morning and at bedtime.   AeroChamber MV inhaler Use as instructed   albuterol 108 (90 Base) MCG/ACT inhaler Commonly known as: VENTOLIN HFA Inhale 1 puff into the lungs See admin instructions. Use 1 puff twice daily (scheduled) & may use every 6 hours if needed for shortness of breath/wheezing.   albuterol 108 (90 Base) MCG/ACT inhaler Commonly known as: VENTOLIN HFA Inhale 2 puffs into the lungs every 6 (six) hours as needed for wheezing or shortness of breath.   aspirin EC 81 MG tablet Take 2 tablets (162 mg total) by mouth in the morning and at bedtime. Swallow whole. What changed: when to take this   celecoxib 200 MG capsule Commonly known as: CELEBREX Take 1 capsule (200 mg total) by mouth 2 (two) times daily.   CINNAMON PO Take 1,000 mg by mouth daily.   doxazosin 8 MG tablet Commonly known as: CARDURA Take 8 mg by mouth at bedtime.   fluticasone 50 MCG/ACT nasal spray Commonly known as: FLONASE Place 1 spray into both nostrils  daily.   lisinopril-hydrochlorothiazide 20-12.5 MG tablet Commonly known as: ZESTORETIC Take 1 tablet by mouth daily.   oxyCODONE 5 MG immediate release tablet Commonly known as: Oxy IR/ROXICODONE Take 1 tablet (5 mg total) by mouth every 4 (four) hours as needed for moderate pain (pain score 4-6).   Propylene Glycol 0.6 % Soln Place 1 drop into both eyes 2 (two)  times daily.   simvastatin 40 MG tablet Commonly known as: ZOCOR Take 40 mg by mouth at bedtime.   Tiotropium Bromide-Olodaterol 2.5-2.5 MCG/ACT Aers Inhale 2 puffs into the lungs daily.   traMADol 50 MG tablet Commonly known as: ULTRAM Take 50 mg by mouth every morning. What changed: Another medication with the same name was added. Make sure you understand how and when to take each.   traMADol 50 MG tablet Commonly known as: ULTRAM Take 1-2 tablets (50-100 mg total) by mouth every 4 (four) hours as needed for moderate pain. What changed: You were already taking a medication with the same name, and this prescription was added. Make sure you understand how and when to take each.   traZODone 50 MG tablet Commonly known as: DESYREL Take 50 mg by mouth at bedtime.   Vitamin B-12 2500 MCG Subl Place 2,500 mcg under the tongue daily.               Durable Medical Equipment  (From admission, onward)           Start     Ordered   11/04/22 1807  DME Walker rolling  Once       Question:  Patient needs a walker to treat with the following condition  Answer:  Total knee replacement status   11/04/22 1806   11/04/22 1807  DME Bedside commode  Once       Comments: Patient is not able to walk the distance required to go the bathroom, or he/she is unable to safely negotiate stairs required to access the bathroom.  A 3in1 BSC will alleviate this problem  Question:  Patient needs a bedside commode to treat with the following condition  Answer:  Total knee replacement status   11/04/22 1806            Follow-up Information     Rayburn Go, PA-C Follow up on 11/19/2022.   Specialty: Orthopedic Surgery Why: at 1:15pm Contact information: [address] [place] [address] [phone]         Donato Heinz, MD Follow up on 12/17/2022.   Specialty: Orthopedic Surgery Why: at 2:15pm Contact information: [address] [facility]  [place] [address] [phone]                 Signed: Gean Branch 11/05/2022, 8:30 AM   Objective: Vital signs in last 24 hours: Temp:  [97.1 F (36.2 C)-98.3 F (36.8 C)] 97.9 F (36.6 C) (10/10 0746) Pulse Rate:  [60-99] 60 (10/10 0746) Resp:  [14-21] 16 (10/10 0746) BP: (108-184)/(50-82) 121/66 (10/10 0746) SpO2:  [95 %-100 %] 96 % (10/10 0746) Weight:  [95.2 kg] 95.2 kg (10/09 0914)  Intake/Output from previous day:  Intake/Output Summary (Last 24 hours) at 11/05/2022 0830 Last data filed at 11/05/2022 0700 Gross per 24 hour  Intake 2913.33 ml  Output 725 ml  Net 2188.33 ml    Intake/Output this shift: No intake/output data recorded.  Labs: No results for input(s): "HGB" in the last 72 hours. No results for input(s): "WBC", "RBC", "HCT", "PLT"  in the last 72 hours. No results for input(s): "NA", "K", "CL", "CO2", "BUN", "CREATININE", "GLUCOSE", "CALCIUM" in the last 72 hours. No results for input(s): "LABPT", "INR" in the last 72 hours.  EXAM: General - Patient is Alert, Appropriate, and Oriented Extremity - Neurologically intact ABD soft Neurovascular intact Sensation intact distally Intact pulses distally Dorsiflexion/Plantar flexion intact No cellulitis present Compartment soft Dressing - dressing C/D/I and no drainage Motor Function - intact, moving foot and toes well on exam.  Able to plantar and dorsiflex with good strength and range of motion.  Able to SLR without any assistance.  Is neurovascularly intact to all dermatomes down his right lower extremity.  Posterior tibial pulses appreciated 2+. JP Drain pulled without difficulty. Intact  Assessment/Plan: 1 Day Post-Op Procedure(s) (LRB): COMPUTER ASSISTED TOTAL KNEE ARTHROPLASTY (Right) Procedure(s) (LRB): COMPUTER ASSISTED TOTAL KNEE ARTHROPLASTY (Right) Past Medical History:  Diagnosis Date   Arthritis    Asthma    Chronic kidney disease    STAGE 3   Colon cancer (HCC)  1982   Partial Colon resection   COPD (chronic obstructive pulmonary disease) (HCC)    CHRONIC BRONCHITIS   Cough    CHRONIC BRONCHITIS   Dyspnea    Hypertension    Pneumonia    Prostate cancer (HCC) 2000   Prostatectomy   Principal Problem:   Total knee replacement status  Estimated body mass index is 30.11 kg/m as calculated from the following:   Height as of this encounter: 5\' 10"  (1.778 m).   Weight as of this encounter: 95.2 kg.  Patient has passed all of his PT protocols and is stable for discharge.  Look to transition to home health physical therapy to continue to work on ROM, strength and gait training with this right lower extremity.  Discussed with the patient continuing to utilize Polar Care   Patient will use bone foam in 20-30 minute intervals   Patient will wear TED hose bilaterally to help prevent DVT and clot formation   Discussed the Aquacel bandage.  This bandage will stay in place 7 days postoperatively.  Can be replaced with honeycomb bandages that will be sent home with the patient   Discussed sending the patient home with tramadol and oxycodone for as needed pain management.  Patient will also be sent home with Celebrex to help with swelling and inflammation.  Patient will take an 81 mg aspirin twice daily for DVT prophylaxis   JP drain removed without difficulty, intact   Weight-Bearing as tolerated to right leg   Patient will follow-up with [facility] clinic orthopedics in 2 weeks for staple removal and reevaluation  Diet - Regular diet Follow up - in 2 weeks Activity - WBAT Disposition - Home Condition Upon Discharge - Good DVT Prophylaxis - Aspirin and TED hose  Danise Edge, PA-C Orthopaedic Surgery 11/05/2022, 8:30 AM

## 2022-11-05 NOTE — Progress Notes (Signed)
Physical Therapy Treatment Patient Details Name: Scott Branch MRN: [id number] DOB: Jan 27, 1935 Today's Date: 11/05/2022   History of Present Illness Patient presented to the hospital on 11/04/2022 for an elective right total knee arthroplasty performed by Dr. Ernest Pine.    PT Comments  Pt is 87 years old admitted to the hospital on 11/04/2022 for a Right Total Knee Replacement. Pt was received in recliner following OT Tx session, and was agreeable to PT session. While in recliner, pt demonstrated and verbalized understanding for HEP and precautions. Each exercise pt performed 5 reps. Goniometric measurements were taken during this time= R knee ext. 1-4 degrees (With and without quad contraction); R knee flex. 113 degrees. Pt performed STS with the use of RW (2wheels) supervision. Pt then amb to hallway to participate in stair negotiation. Pt went up and down 4 steps with the use of bilat railings CGA. Pt ended session in bed where bed mobility was performed with supervision. Ice and elevation was applied. Pt tolerated Tx well today and will continue to benefit from skilled PT session following D/C with HomeHealth.    If plan is discharge home, recommend the following: A little help with walking and/or transfers;A little help with bathing/dressing/bathroom;Assist for transportation;Help with stairs or ramp for entrance   Can travel by private vehicle        Equipment Recommendations  Rolling walker (2 wheels)    Recommendations for Other Services       Precautions / Restrictions Precautions Precautions: Knee Precaution Booklet Issued: Yes (comment) Precaution Comments: WBAT with amb. Booklet including HEP was provided to pt. Pt demonstrated and verbalized understanding to precautions and HEP. Restrictions Weight Bearing Restrictions: Yes RLE Weight Bearing: Weight bearing as tolerated     Mobility  Bed Mobility Overal bed mobility: Needs Assistance             General bed  mobility comments: Pt performed STS and sit to supine with supervision.    Transfers Overall transfer level: Needs assistance Equipment used: Rolling walker (2 wheels) Transfers: Sit to/from Stand Sit to Stand: Supervision           General transfer comment: Pt recieved in recliner at the start of session. Pt performed STS with the use of RW (2wheels) supervision. At the end of the session, pt ended session in bed, where sitting at EOB and bed mobility were supervision.    Ambulation/Gait Ambulation/Gait assistance: Contact guard assist Gait Distance (Feet): 200 Feet Assistive device: Rolling walker (2 wheels) Gait Pattern/deviations: Step-through pattern     Pre-gait activities: Pt demonstrated and verbalized understanding for HEP. General Gait Details: Pt amb with RW (2wheels) CGA. Pt amb comfortably from room to hallway and back to room efficiently.   Stairs Stairs: Yes Stairs assistance: Contact guard assist Stair Management: Two rails Number of Stairs: 8 General stair comments: Pt performed stair negotiation CGA. Pt went up 4 steps and back down. He performed this 2 times. 8 steps in total. Pt demonstrated and verbalized understanding for safe precautions with stair negotiation.   Wheelchair Mobility     Tilt Bed    Modified Rankin (Stroke Patients Only)       Balance Overall balance assessment: No apparent balance deficits (not formally assessed)                                          Cognition Arousal: Alert  Behavior During Therapy: WFL for tasks assessed/performed Overall Cognitive Status: Within Functional Limits for tasks assessed                                 General Comments: AOx4. Pt pleasant and willing to participate in PT session.        Exercises Total Joint Exercises Ankle Circles/Pumps: AROM, 5 reps, Both, Supine Quad Sets: AROM, 5 reps, Right, Supine Short Arc Quad: AROM, Right, 5 reps, Supine Hip  ABduction/ADduction: AROM, Right, 5 reps, Supine Straight Leg Raises: AROM, Right, 5 reps, Supine Knee Flexion: AROM, Right, 10 reps, Seated Goniometric ROM: Right Knee Extension= 1-4 degrees; Right Knee Flexion= 113 degrees    General Comments        Pertinent Vitals/Pain Pain Assessment Pain Assessment: No/denies pain    Home Living Family/patient expects to be discharged to:: Private residence Living Arrangements: Spouse/significant other Available Help at Discharge: Family;Available 24 hours/day Type of Home: House Home Access: Ramped entrance       Home Layout: Two level;Able to live on main level with bedroom/bathroom Home Equipment: None      Prior Function            PT Goals (current goals can now be found in the care plan section) Acute Rehab PT Goals Patient Stated Goal: To go home. PT Goal Formulation: With patient Time For Goal Achievement: 11/19/22 Potential to Achieve Goals: Good    Frequency    BID      PT Plan      Co-evaluation              AM-PAC PT "6 Clicks" Mobility   Outcome Measure  Help needed turning from your back to your side while in a flat bed without using bedrails?: None Help needed moving from lying on your back to sitting on the side of a flat bed without using bedrails?: None Help needed moving to and from a bed to a chair (including a wheelchair)?: A Little Help needed standing up from a chair using your arms (e.g., wheelchair or bedside chair)?: A Little Help needed to walk in hospital room?: A Little Help needed climbing 3-5 steps with a railing? : A Lot 6 Click Score: 19    End of Session Equipment Utilized During Treatment: Gait belt Activity Tolerance: Patient tolerated treatment well Patient left: in bed;with call bell/phone within reach;with family/visitor present;Other (comment) (Ice applied) Nurse Communication: Mobility status PT Visit Diagnosis: Muscle weakness (generalized) (M62.81);Pain;Difficulty  in walking, not elsewhere classified (R26.2) Pain - Right/Left: Right Pain - part of body: Knee     Time: 6387-5643 PT Time Calculation (min) (ACUTE ONLY): 30 min  Charges:    $Gait Training: 8-22 mins PT General Charges $$ ACUTE PT VISIT: 1 Visit                     Majorie Santee [place] Howard SPT, LAT, ATC    Merlinda Wrubel Sauvignon-Howard 11/05/2022, 2:40 PM

## 2022-11-05 NOTE — Evaluation (Signed)
Occupational Therapy Evaluation Patient Details Name: Scott Branch MRN: [id number] DOB: [birth year] Today's Date: 11/05/2022   History of Present Illness Patient presented to the hospital on 11/04/2022 for an elective right total knee arthroplasty performed by Dr. Ernest Branch.   Clinical Impression   Mr Scott Branch was seen for OT evaluation this date. Prior to hospital admission, pt was IND. Pt lives with spouse in 2 level home, lives on main floor. Pt currently requires MOD A don compression sock in sitting. IND don regular socks, shoes, underwear, and pants. MOD I for ADL t/f using RW. Pt instructed in polar care mgt, falls prevention strategies, and compression stocking mgt. All education complete, will sign off. Pt would benefit from skilled OT to address noted impairments and functional limitations (see below for any additional details). Upon hospital discharge, recommend no OT follow up.    If plan is discharge home, recommend the following: Help with stairs or ramp for entrance    Functional Status Assessment  Patient has had a recent decline in their functional status and demonstrates the ability to make significant improvements in function in a reasonable and predictable amount of time.  Equipment Recommendations  Other (comment) (RW)    Recommendations for Other Services       Precautions / Restrictions Precautions Precautions: Knee Restrictions Weight Bearing Restrictions: Yes RLE Weight Bearing: Weight bearing as tolerated      Mobility Bed Mobility Overal bed mobility: Independent                  Transfers Overall transfer level: Needs assistance Equipment used: None Transfers: Sit to/from Stand Sit to Stand: Supervision                  Balance Overall balance assessment: No apparent balance deficits (not formally assessed)                                         ADL either performed or assessed with clinical judgement    ADL Overall ADL's : Needs assistance/impaired                                       General ADL Comments: MOD A don compression sock in sitting. IND don regular socks, shoes, underwear, and pants. MOD I for ADL t/f      Pertinent Vitals/Pain Pain Assessment Pain Assessment: No/denies pain     Extremity/Trunk Assessment Upper Extremity Assessment Upper Extremity Assessment: Overall WFL for tasks assessed   Lower Extremity Assessment Lower Extremity Assessment: Overall WFL for tasks assessed       Communication Communication Communication: No apparent difficulties   Cognition Arousal: Alert Behavior During Therapy: WFL for tasks assessed/performed Overall Cognitive Status: Within Functional Limits for tasks assessed                                                  Home Living Family/patient expects to be discharged to:: Private residence Living Arrangements: Spouse/significant other Available Help at Discharge: Family;Available 24 hours/day Type of Home: House Home Access: Ramped entrance     Home Layout: Two level;Able to live on main level with bedroom/bathroom  Bathroom Shower/Tub: Runner, broadcasting/film/video: None          Prior Functioning/Environment Prior Level of Function : Independent/Modified Independent               ADLs Comments: pt enjoys golfing        OT Problem List: Decreased activity tolerance         OT Goals(Current goals can be found in the care plan section) Acute Rehab OT Goals Patient Stated Goal: to go home OT Goal Formulation: With patient/family Time For Goal Achievement: 11/05/22 Potential to Achieve Goals: Good   AM-PAC OT "6 Clicks" Daily Activity     Outcome Measure Help from another person eating meals?: None Help from another person taking care of personal grooming?: None Help from another person toileting, which includes using toliet, bedpan, or urinal?:  None Help from another person bathing (including washing, rinsing, drying)?: A Little Help from another person to put on and taking off regular upper body clothing?: None Help from another person to put on and taking off regular lower body clothing?: None 6 Click Score: 23   End of Session Equipment Utilized During Treatment: Rolling walker (2 wheels)  Activity Tolerance: Patient tolerated treatment well Patient left: in chair;with call bell/phone within reach  OT Visit Diagnosis: Other abnormalities of gait and mobility (R26.89)                Time: 1610-9604 OT Time Calculation (min): 16 min Charges:  OT General Charges $OT Visit: 1 Visit OT Evaluation $OT Eval Low Complexity: 1 Low  Scott Branch, M.S. OTR/L  11/05/22, 9:22 AM  ascom [phone]

## 2022-11-05 NOTE — Progress Notes (Signed)
DISCHARGE NOTE:  Pt and wife given discharge instructions and scripts. Pt verbalized understanding. 2 honeycomb dressings sent with pt, along with walker. TED hose on both legs. Pt wheeled to car by staff. Wife providing transportation.

## 2022-11-05 NOTE — Progress Notes (Signed)
Subjective: 1 Day Post-Op Procedure(s) (LRB): COMPUTER ASSISTED TOTAL KNEE ARTHROPLASTY (Right) Patient reports pain as 0 on 0-10 scale.   Patient seen in rounds with Dr. Ernest Pine. Patient is well, and has had no acute complaints or problems. Denies any CP, SOB, N/V, fevers or chills We will start therapy today.  Plan is to go Home after hospital stay.  Objective: Vital signs in last 24 hours: Temp:  [97.1 F (36.2 C)-98.3 F (36.8 C)] 97.9 F (36.6 C) (10/10 0746) Pulse Rate:  [60-99] 60 (10/10 0746) Resp:  [14-21] 16 (10/10 0746) BP: (108-184)/(50-82) 121/66 (10/10 0746) SpO2:  [95 %-100 %] 96 % (10/10 0746) Weight:  [95.2 kg] 95.2 kg (10/09 0914)  Intake/Output from previous day:  Intake/Output Summary (Last 24 hours) at 11/05/2022 0752 Last data filed at 11/05/2022 0700 Gross per 24 hour  Intake 2913.33 ml  Output 725 ml  Net 2188.33 ml    Intake/Output this shift: No intake/output data recorded.  Labs: No results for input(s): "HGB" in the last 72 hours. No results for input(s): "WBC", "RBC", "HCT", "PLT" in the last 72 hours. No results for input(s): "NA", "K", "CL", "CO2", "BUN", "CREATININE", "GLUCOSE", "CALCIUM" in the last 72 hours. No results for input(s): "LABPT", "INR" in the last 72 hours.  EXAM General - Patient is Alert, Appropriate, and Oriented Extremity - Neurologically intact ABD soft Neurovascular intact Sensation intact distally Intact pulses distally Dorsiflexion/Plantar flexion intact No cellulitis present Compartment soft Dressing - dressing C/D/I and no drainage Motor Function - intact, moving foot and toes well on exam.  Able to plantar and dorsiflex with good strength and range of motion.  Able to SLR without any assistance.  Is neurovascularly intact to all dermatomes down his right lower extremity.  Posterior tibial pulses appreciated 2+. JP Drain pulled without difficulty. Intact  Past Medical History:  Diagnosis Date   Arthritis     Asthma    Chronic kidney disease    STAGE 3   Colon cancer (HCC) 1982   Partial Colon resection   COPD (chronic obstructive pulmonary disease) (HCC)    CHRONIC BRONCHITIS   Cough    CHRONIC BRONCHITIS   Dyspnea    Hypertension    Pneumonia    Prostate cancer (HCC) 2000   Prostatectomy    Assessment/Plan: 1 Day Post-Op Procedure(s) (LRB): COMPUTER ASSISTED TOTAL KNEE ARTHROPLASTY (Right) Principal Problem:   Total knee replacement status  Estimated body mass index is 30.11 kg/m as calculated from the following:   Height as of this encounter: 5\' 10"  (1.778 m).   Weight as of this encounter: 95.2 kg. Advance diet Up with therapy  Patient will continue to work with physical therapy to pass postoperative PT protocols, ROM and strengthening  Discussed with the patient continuing to utilize Polar Care  Patient will use bone foam in 20-30 minute intervals  Patient will wear TED hose bilaterally to help prevent DVT and clot formation  Discussed the Aquacel bandage.  This bandage will stay in place 7 days postoperatively.  Can be replaced with honeycomb bandages that will be sent home with the patient  Discussed sending the patient home with tramadol and oxycodone for as needed pain management.  Patient will also be sent home with Celebrex to help with swelling and inflammation.  Patient will take an 81 mg aspirin twice daily for DVT prophylaxis  JP drain removed without difficulty, intact  Weight-Bearing as tolerated to right leg  Patient will follow-up with [facility] clinic orthopedics in  2 weeks for staple removal and reevaluation  Rayburn Go, PA-C [facility] Orthopaedics 11/05/2022, 7:52 AM\

## 2022-11-05 NOTE — Care Management Obs Status (Signed)
MEDICARE OBSERVATION STATUS NOTIFICATION   Patient Details  Name: Scott Branch MRN: [id number] Date of Birth: 1935/06/24   Medicare Observation Status Notification Given:  Yes    Marlowe Sax, RN 11/05/2022, 9:41 AM

## 2023-01-06 ENCOUNTER — Encounter: Payer: Self-pay | Admitting: Dermatology

## 2023-01-06 ENCOUNTER — Ambulatory Visit: Payer: Medicare Other | Admitting: Dermatology

## 2023-01-06 DIAGNOSIS — W908XXA Exposure to other nonionizing radiation, initial encounter: Secondary | ICD-10-CM | POA: Diagnosis not present

## 2023-01-06 DIAGNOSIS — L578 Other skin changes due to chronic exposure to nonionizing radiation: Secondary | ICD-10-CM | POA: Diagnosis not present

## 2023-01-06 DIAGNOSIS — L570 Actinic keratosis: Secondary | ICD-10-CM

## 2023-01-06 DIAGNOSIS — Z7189 Other specified counseling: Secondary | ICD-10-CM

## 2023-01-06 DIAGNOSIS — L820 Inflamed seborrheic keratosis: Secondary | ICD-10-CM | POA: Diagnosis not present

## 2023-01-06 DIAGNOSIS — Z5111 Encounter for antineoplastic chemotherapy: Secondary | ICD-10-CM

## 2023-01-06 DIAGNOSIS — Z79899 Other long term (current) drug therapy: Secondary | ICD-10-CM

## 2023-01-06 MED ORDER — FLUOROURACIL 5 % EX CREA: TOPICAL_CREAM | Freq: Two times a day (BID) | CUTANEOUS | 3 refills | Status: AC

## 2023-01-06 NOTE — Patient Instructions (Addendum)
In January 2025 Start 5-fluorouracil/calcipotriene cream twice a day for 7 days to affected areas including forehead to hairline, temples. Prescription sent to [facility]. Patient advised they will receive an email to purchase the medication online and have it sent to their home. Patient provided with handout reviewing treatment course and side effects and advised to call or message [place] on MyChart with any concerns.  Instructions for [facility] Medications  One or more of your medications was sent to the [facility] mail order compounding pharmacy. You will receive an email from them and can purchase the medicine through that link. It will then be mailed to your home at the address you confirmed. If for any reason you do not receive an email from them, please check your spam folder. If you still do not find the email, please let us know. [facility] phone number is [phone].    Cryotherapy Aftercare  Wash gently with soap and water everyday.   Apply Vaseline and Band-Aid daily until healed.     Due to recent changes in healthcare laws, you may see results of your pathology and/or laboratory studies on MyChart before the doctors have had a chance to review them. We understand that in some cases there may be results that are confusing or concerning to you. Please understand that not all results are received at the same time and often the doctors may need to interpret multiple results in order to provide you with the best plan of care or course of treatment. Therefore, we ask that you please give [place] 2 business days to thoroughly review all your results before contacting the office for clarification. Should we see a critical lab result, you will be contacted sooner.   If You Need Anything After Your Visit  If you have any questions or concerns for your doctor, please call our main line at [phone] and press option 4 to reach your doctor's medical  assistant. If no one answers, please leave a voicemail as directed and we will return your call as soon as possible. Messages left after 4 pm will be answered the following business day.   You may also send [place] a message via MyChart. We typically respond to MyChart messages within 1-2 business days.  For prescription refills, please ask your pharmacy to contact our office. Our fax number is [phone].  If you have an urgent issue when the clinic is closed that cannot wait until the next business day, you can page your doctor at the number below.    Please note that while we do our best to be available for urgent issues outside of office hours, we are not available 24/7.   If you have an urgent issue and are unable to reach [place], you may choose to seek medical care at your doctor's office, retail clinic, urgent care center, or emergency room.  If you have a medical emergency, please immediately call 911 or go to the emergency department.  Pager Numbers  - Dr. Gwen Pounds: [phone]  - Dr. Roseanne Reno: [phone]  - Dr. Katrinka Blazing: [phone]   In the event of inclement weather, please call our main line at [phone] for an update on the status of any delays or closures.  Dermatology Medication Tips: Please keep the boxes that topical medications come in in order to help keep track of the instructions about where and how to use these. Pharmacies typically print the medication instructions only on the boxes and not directly on the medication  tubes.   If your medication is too expensive, please contact our office at [phone] option 4 or send [place] a message through MyChart.   We are unable to tell what your co-pay for medications will be in advance as this is different depending on your insurance coverage. However, we may be able to find a substitute medication at lower cost or fill out paperwork to get insurance to cover a needed medication.   If a prior authorization is required to get  your medication covered by your insurance company, please allow [place] 1-2 business days to complete this process.  Drug prices often vary depending on where the prescription is filled and some pharmacies may offer cheaper prices.  The website www.goodrx.com contains coupons for medications through different pharmacies. The prices here do not account for what the cost may be with help from insurance (it may be cheaper with your insurance), but the website can give you the price if you did not use any insurance.  - You can print the associated coupon and take it with your prescription to the pharmacy.  - You may also stop by our office during regular business hours and pick up a GoodRx coupon card.  - If you need your prescription sent electronically to a different pharmacy, notify our office through [facility] or by phone at [phone] option 4.     Si Usted Necesita Algo Despus de Su Visita  Tambin puede enviarnos un mensaje a travs de Clinical cytogeneticist. Por lo general respondemos a los mensajes de MyChart en el transcurso de 1 a 2 das hbiles.  Para renovar recetas, por favor pida a su farmacia que se ponga en contacto con nuestra oficina. Annie Sable de fax es [place] [phone].  Si tiene un asunto urgente cuando la clnica est cerrada y que no puede esperar hasta el siguiente da hbil, puede llamar/localizar a su doctor(a) al nmero que aparece a continuacin.   Por favor, tenga en cuenta que aunque hacemos todo lo posible para estar disponibles para asuntos urgentes fuera del horario de [place], no estamos disponibles las 24 horas del da, los 7 [address]  [address] de la [place].   Si tiene un problema urgente y no puede comunicarse con nosotros, puede optar por buscar atencin mdica  en el consultorio de su doctor(a), en una clnica privada, en un centro de atencin urgente o en una sala de emergencias.  Si tiene Engineer, drilling, por favor llame inmediatamente al 911 o vaya a la sala de  emergencias.  Nmeros de bper  - Dr. Gwen Pounds: [phone]  - Dra. Roseanne Reno: [phone]  - Dr. Katrinka Blazing: [phone]   En caso de inclemencias del tiempo, por favor llame a Lacy Duverney principal al [phone] para una actualizacin sobre el [place] de cualquier retraso o cierre.  Consejos para la medicacin en dermatologa: Por favor, guarde las cajas en las que vienen los medicamentos de uso tpico para ayudarle a seguir las instrucciones sobre dnde y cmo usarlos. Las farmacias generalmente imprimen las instrucciones del medicamento slo en las cajas y no directamente en los tubos del [place].   Si su medicamento es muy caro, por favor, pngase en contacto con Rolm [place]ala llamando al [phone] y presione la opcin 4 o envenos un mensaje a travs de Clinical cytogeneticist.   No podemos decirle cul ser su copago por los medicamentos por adelantado ya que esto es diferente dependiendo de la cobertura de su seguro. Sin embargo, es posible que podamos encontrar un medicamento sustituto a Adult nurse  costo o llenar un formulario para que el seguro cubra el medicamento que se considera necesario.   Si se requiere una autorizacin previa para que su compaa de seguros [place] su medicamento, por favor permtanos de 1 a 2 das hbiles para completar [address]  Los precios de los medicamentos varan con frecuencia dependiendo del Environmental consultant de dnde se surte la receta y alguna farmacias pueden ofrecer precios ms baratos.  El sitio web www.goodrx.com tiene cupones para medicamentos de Health and safety inspector. Los precios aqu no tienen en cuenta lo que podra costar con la ayuda del seguro (puede ser ms barato con su seguro), pero el sitio web puede darle el precio si no utiliz Tourist information centre manager.  - Puede imprimir el cupn correspondiente y llevarlo con su receta a la farmacia.  - Tambin puede pasar por nuestra oficina durante el horario de atencin regular y Education officer, museum una tarjeta de cupones de GoodRx.  - Si  necesita que su receta se enve electrnicamente a una farmacia diferente, informe a nuestra oficina a travs de MyChart de  o por telfono llamando al [phone] y presione la opcin 4.

## 2023-01-06 NOTE — Progress Notes (Signed)
Follow-Up Visit   Subjective  Scott Branch is a 87 y.o. adult who presents for the following: AK 20m f/u face, scalp, neck, check spots R ear, R temple scaly The patient has spots, moles and lesions to be evaluated, some may be new or changing and the patient may have concern these could be cancer.   The following portions of the chart were reviewed this encounter and updated as appropriate: medications, allergies, medical history  Review of Systems:  No other skin or systemic complaints except as noted in HPI or Assessment and Plan.  Objective  Well appearing patient in no apparent distress; mood and affect are within normal limits.   A focused examination was performed of the following areas: face  Relevant exam findings are noted in the Assessment and Plan.  scalp, fae, ears x 16 (16) Pink scaly macules R sideburn x 1 Stuck on waxy paps with erythema   Assessment & Plan   ACTINIC DAMAGE WITH PRECANCEROUS ACTINIC KERATOSES Counseling for Topical Chemotherapy Management: Patient exhibits: - Severe, confluent actinic changes with pre-cancerous actinic keratoses that is secondary to cumulative UV radiation exposure over time - Condition that is severe; chronic, not at goal. - diffuse scaly erythematous macules and papules with underlying dyspigmentation - Discussed Prescription "Field Treatment" topical Chemotherapy for Severe, Chronic Confluent Actinic Changes with Pre-Cancerous Actinic Keratoses Field treatment involves treatment of an entire area of skin that has confluent Actinic Changes (Sun/ Ultraviolet light damage) and PreCancerous Actinic Keratoses by method of PhotoDynamic Therapy (PDT) and/or prescription Topical Chemotherapy agents such as 5-fluorouracil, 5-fluorouracil/calcipotriene, and/or imiquimod.  The purpose is to decrease the number of clinically evident and subclinical PreCancerous lesions to prevent progression to development of skin cancer by  chemically destroying early precancer changes that may or may not be visible.  It has been shown to reduce the risk of developing skin cancer in the treated area. As a result of treatment, redness, scaling, crusting, and open sores may occur during treatment course. One or more than one of these methods may be used and may have to be used several times to control, suppress and eliminate the PreCancerous changes. Discussed treatment course, expected reaction, and possible side effects. - Recommend daily broad spectrum sunscreen SPF 30+ to sun-exposed areas, reapply every 2 hours as needed.  - Staying in the shade or wearing long sleeves, sun glasses (UVA+UVB protection) and wide brim hats (4-inch brim around the entire circumference of the hat) are also recommended. - Call for new or changing lesions.  - In January 2025 Start 5-fluorouracil/calcipotriene cream twice a day for 7 days to affected areas including forehead to hairline, temples. Prescription sent to [facility]. Patient advised they will receive an email to purchase the medication online and have it sent to their home. Patient provided with handout reviewing treatment course and side effects and advised to call or message [place] on MyChart with any concerns.  Reviewed course of treatment and expected reaction.  Patient advised to expect inflammation and crusting and advised that erosions are possible.  Patient advised to be diligent with sun protection during and after treatment. Counseled to keep medication out of reach of children and pets.   AK (ACTINIC KERATOSIS) (16) scalp, fae, ears x 16 (16) Actinic keratoses are precancerous spots that appear secondary to cumulative UV radiation exposure/sun exposure over time. They are chronic with expected duration over 1 year. A portion of actinic keratoses will progress to squamous cell carcinoma of the  skin. It is not possible to reliably predict which spots will progress to  skin cancer and so treatment is recommended to prevent development of skin cancer.  Recommend daily broad spectrum sunscreen SPF 30+ to sun-exposed areas, reapply every 2 hours as needed.  Recommend staying in the shade or wearing long sleeves, sun glasses (UVA+UVB protection) and wide brim hats (4-inch brim around the entire circumference of the hat). Call for new or changing lesions. Destruction of lesion - scalp, fae, ears x 16 (16) Complexity: simple   Destruction method: cryotherapy   Informed consent: discussed and consent obtained   Timeout:  patient name, date of birth, surgical site, and procedure verified Lesion destroyed using liquid nitrogen: Yes   Region frozen until ice ball extended beyond lesion: Yes   Outcome: patient tolerated procedure well with no complications   Post-procedure details: wound care instructions given   INFLAMED SEBORRHEIC KERATOSIS R sideburn x 1 Symptomatic, irritating, patient would like treated. Destruction of lesion - R sideburn x 1 Complexity: simple   Destruction method: cryotherapy   Informed consent: discussed and consent obtained   Timeout:  patient name, date of birth, surgical site, and procedure verified Lesion destroyed using liquid nitrogen: Yes   Region frozen until ice ball extended beyond lesion: Yes   Outcome: patient tolerated procedure well with no complications   Post-procedure details: wound care instructions given     Return in about 6 months (around 07/07/2023) for Hx of AKs.  I, Ardis Rowan, RMA, am acting as scribe for Armida Sans, MD .   Documentation: I have reviewed the above documentation for accuracy and completeness, and I agree with the above.  Armida Sans, MD

## 2023-03-24 ENCOUNTER — Encounter: Payer: Self-pay | Admitting: Pulmonary Disease

## 2023-03-24 ENCOUNTER — Ambulatory Visit: Payer: Medicare Other | Admitting: Pulmonary Disease

## 2023-03-24 VITALS — BP 118/58 | HR 83 | Temp 96.9°F | Ht 70.0 in | Wt 216.8 lb

## 2023-03-24 DIAGNOSIS — R0602 Shortness of breath: Secondary | ICD-10-CM | POA: Diagnosis not present

## 2023-03-24 DIAGNOSIS — J439 Emphysema, unspecified: Secondary | ICD-10-CM

## 2023-03-24 DIAGNOSIS — J8410 Pulmonary fibrosis, unspecified: Secondary | ICD-10-CM | POA: Diagnosis not present

## 2023-03-24 DIAGNOSIS — R062 Wheezing: Secondary | ICD-10-CM | POA: Diagnosis not present

## 2023-03-24 LAB — NITRIC OXIDE: Nitric Oxide: 18

## 2023-03-24 MED ORDER — TRELEGY ELLIPTA 100-62.5-25 MCG/ACT IN AEPB: 1.0000 | INHALATION_SPRAY | RESPIRATORY_TRACT | Status: DC

## 2023-03-24 NOTE — Patient Instructions (Signed)
 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 You reported no current breathing difficulties and find relief with your Stiolto inhaler. However, due to recent wheezing and the presence of airway inflammation, we are changing your medication to Trelegy, which contains an anti-inflammatory agent that may help manage your symptoms better.  YOUR PLAN:  -CHRONIC OBSTRUCTIVE PULMONARY DISEASE (COPD) WITH FIBROSIS: COPD is a lung disease that makes it hard to breathe and fibrosis is a condition where the lungs become scarred. We are changing your medication from Stiolto to Trelegy, which may help reduce your symptoms and potentially improve your shortness of breath. We will also order a chest CT scan to better understand the scarring in your lungs and schedule pulmonary function tests to assess your lung function.  -AIRWAY INFLAMMATION: Airway inflammation is a condition that causes your airways to become inflamed and narrow, making it hard to breathe.  Had asthma as a child but this is currently an active, we noted some wheezing during your examination. The new medication, Trelegy, may help manage these symptoms due to its anti-inflammatory properties.  INSTRUCTIONS:  Please discontinue your use of Stiolto and start using Trelegy once daily. Remember to rinse your mouth after use. We have provided a sample of Trelegy to last approximately one month. Monitor for any changes in wheezing or respiratory symptoms with the new medication. We will follow up in three months to evaluate the effectiveness of Trelegy and your overall lung health.

## 2023-03-24 NOTE — Progress Notes (Signed)
 Subjective:    Patient ID: Scott Branch, adult    DOB: 12-03-1935, 88 y.o.   MRN: [id number]  Patient Care Team: Lauro Regulus, MD as PCP - General (Internal Medicine)  Chief Complaint  Patient presents with   Consult    DOE. No wheezing. Cough in the evenings with yellow sputum.     BACKGROUND: Patient is an 88 year old patient with COPD and bibasilar fibrosis who presents to establish care with me.  He was previously followed by Dr. Durel Salts.   HPI Discussed the use of AI scribe software for clinical note transcription with the patient, who gave verbal consent to proceed.  History of Present Illness   The patient, with COPD and mild bibasilar fibrosis, presents to establish care with me.  Previously followed by Dr. Durel Salts.  He has no current breathing difficulties and previously underwent pulmonary function tests in February of the previous year, which showed mild restriction and mild diffusion capacity impairment. He uses a Stiolto inhaler daily, which he finds helpful, and albuterol inhaler as needed. He has a history of asthma from childhood, which had resolved but he wonders if it has recurred, as he has noticed some wheezing.  He has mild fibrosis in the lungs, likely related to his occupational exposure to dust and fumes. He was previously prescribed a medication for fibrosis (Ofev), but due to insurance issues, he did not proceed with the treatment.  The patient also is not interested in antifibrotics.  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 He has a history of pneumonia in the past.  No current breathing difficulties.  Only complaint of cough in the evenings with scant sputum production, yellow.   No hemoptysis.  Chest pain.  No shortness of breath over his baseline which is only with severe exertion.  This is not a new issue.  Has a history prostate and colon cancer in the past.   DATA 03/06/2022 CT chest with contrast: Centrilobular emphysema and airway thickening, interstitial lung disease with fibrotic features.  No mediastinal adenopathy. 03/25/2022 PFTs: FEV1 2.29 L or 86% predicted, FVC 2.82 L or 74% predicted, FEV1/FVC 81% no bronchodilator response, mild reduction in lung volumes, mild diffusion capacity impairment which corrects to normal by alveolar volume.   Review of Systems A 10 point review of systems was performed and it is as noted above otherwise negative.   Past Medical History:  Diagnosis Date   Arthritis    Asthma    Chronic kidney disease    STAGE 3   Colon cancer (HCC) 1982   Partial Colon resection   COPD (chronic obstructive pulmonary disease) (HCC)    CHRONIC BRONCHITIS   Cough    CHRONIC BRONCHITIS   Dyspnea    Hypertension    Pneumonia    Prostate cancer (HCC) 2000   Prostatectomy    Past Surgical History:  Procedure Laterality Date   BLEPHAROPLASTY Bilateral 2020   COLON SURGERY     EYE SURGERY     Bilateral cataract surgery.   HERNIA REPAIR     X 2 INGUINAL   KNEE ARTHROPLASTY Right 11/04/2022   Procedure: COMPUTER ASSISTED TOTAL KNEE ARTHROPLASTY;  Surgeon: Donato Heinz, MD;  Location: [facility] OR[facility];  Service: Orthopedics;  Laterality: Right;   KNEE ARTHROSCOPY Left 08/27/2017   Procedure: ARTHROSCOPY KNEE  PARTIAL MEDIAL MENISECTOMY CHONDROPLASTY;  Surgeon: Donato Heinz, MD;  Location: [facility];  Service: Orthopedics;  Laterality: Left;   KNEE ARTHROSCOPY Right 08/28/2019   Procedure: ARTHROSCOPY KNEE;  Surgeon: Donato Heinz, MD;  Location: [facility];  Service: Orthopedics;  Laterality: Right;   PROSTATECTOMY     SHOULDER ARTHROSCOPY WITH OPEN ROTATOR CUFF REPAIR Right 01/13/2018   Procedure: SHOULDER ARTHROSCOPY WITH OPEN ROTATOR  CUFF REPAIR;  Surgeon: Christena Flake, MD;  Location: [facility];  Service: Orthopedics;  Laterality: Right;    Patient Active Problem List   Diagnosis Date Noted   Total knee replacement status 11/04/2022   IPF (idiopathic pulmonary fibrosis) (HCC) 03/25/2022   Aortic atherosclerosis (HCC) 03/08/2020   Recent URI 07/20/2018   Dermatochalasis of both upper eyelids 06/14/2018   Ptosis of eyelid 06/14/2018   Primary osteoarthritis of left knee 11/07/2017   Medial epicondylitis of elbow, right 12/18/2013   Hyperglycemia, unspecified 06/18/2013   Prostate cancer (HCC) 06/18/2013   Controlled type 2 diabetes mellitus with complication, without long-term current use of insulin (HCC) 06/18/2013   Male urinary stress incontinence 05/11/2013   Benign hypertension with chronic kidney disease, stage III (HCC) 01/04/2013   Cataract 01/04/2013   COPD with acute exacerbation (HCC) 01/04/2013   Hyperlipidemia 01/04/2013   Spermatocele of epididymis 08/17/2012   Bronchitis 03/09/2011    Family History  Problem Relation Age of Onset   Lung disease Neg Hx    Lung cancer Neg Hx     Social History   Tobacco Use   Smoking status: Former    Current packs/day: 0.00    Types: Cigarettes    Quit date: 08/19/1980    Years since quitting: 42.6   Smokeless tobacco: Never   Tobacco comments:    Started smoking at 20 years.     Smoked 2 PPD at his heaviest.    Quit smoking in 1982.  Khj 03/24/2023  Substance Use Topics   Alcohol use: Yes    Alcohol/week: 1.0 standard drink of alcohol    Types: 1 Cans of beer per week    Comment: OCCAS    No Known Allergies  Current Meds  Medication Sig   acetaminophen (TYLENOL) 650 MG CR tablet Take 1,300 mg by mouth in the morning and at bedtime.    albuterol (VENTOLIN HFA) 108 (90 Base) MCG/ACT inhaler Inhale 2 puffs into the lungs every 6 (six) hours as needed for wheezing or shortness of breath.   aspirin EC 81 MG tablet Take 2 tablets (162 mg total) by  mouth in the morning and at bedtime. Swallow whole.   celecoxib (CELEBREX) 200 MG capsule Take 1 capsule (200 mg total) by mouth 2 (two) times daily.   CINNAMON PO Take 1,000 mg by mouth daily.   Cyanocobalamin (VITAMIN B-12) 2500 MCG SUBL Place 2,500 mcg under the tongue daily.   doxazosin (CARDURA) 8 MG tablet Take 8 mg by mouth at bedtime.    fluticasone (FLONASE) 50 MCG/ACT nasal spray Place 1 spray into both nostrils daily.    lisinopril-hydrochlorothiazide (PRINZIDE,ZESTORETIC) 20-12.5 MG per tablet Take 1 tablet by mouth daily.    Propylene Glycol 0.6 % SOLN Place 1 drop into both eyes 2 (two) times daily.   simvastatin (ZOCOR) 40 MG tablet Take 40 mg by mouth at bedtime.    Spacer/Aero-Holding Chambers (AEROCHAMBER MV) inhaler Use as instructed   Tiotropium Bromide-Olodaterol 2.5-2.5 MCG/ACT  AERS Inhale 2 puffs into the lungs daily.    traMADol (ULTRAM) 50 MG tablet Take 1-2 tablets (50-100 mg total) by mouth every 4 (four) hours as needed for moderate pain.   traZODone (DESYREL) 50 MG tablet Take 50 mg by mouth at bedtime.    Immunization History  Administered Date(s) Administered   Influenza Inj Mdck Quad Pf 10/14/2018, 11/19/2021   Influenza, High Dose Seasonal PF 10/22/2022   Influenza, Seasonal, Injecte, Preservative Fre 12/16/2004, 11/24/2009, 10/26/2011   Influenza,inj,Quad PF,6+ Mos 10/30/2013, 11/06/2014, 10/22/2015, 10/04/2019   Influenza-Unspecified 12/07/2012, 10/30/2013, 11/06/2014, 10/22/2015, 10/16/2016, 10/29/2017, 11/08/2020   Moderna SARS-COV2 Booster Vaccination 10/22/2022   Moderna Sars-Covid-2 Vaccination 02/07/2019, 03/07/2019   Pneumococcal Conjugate-13 10/30/2013   Zoster, Live 06/22/2012        Objective:     BP (!) 118/58 (BP Location: Right Arm, Cuff Size: Large)   Pulse 83   Temp (!) 96.9 F (36.1 C)   Ht 5\' 10"  (1.778 m)   Wt 216 lb 12.8 oz (98.3 kg)   SpO2 94%   BMI 31.11 kg/m   SpO2: 94 % O2 Device: None (Room air)  GENERAL:  Well-developed, well-nourished elderly gentleman, age-appropriate, no acute distress, fully ambulatory.  Quite spry. HEAD: Normocephalic, atraumatic.  EYES: Pupils equal, round, reactive to light.  No scleral icterus.  MOUTH: Poor dentition, multiple chipped teeth, missing teeth.  Oral mucosa moist.  No thrush. NECK: Supple. No thyromegaly. Trachea midline. No JVD.  No adenopathy. PULMONARY: Good air entry bilaterally.  Faint bibasal crackles, coarse, faint end expiratory wheezes noted. CARDIOVASCULAR: S1 and S2. Regular rate and rhythm.  No rubs, murmurs or gallops heard. ABDOMEN: Benign. MUSCULOSKELETAL: No joint deformity, no clubbing, no edema.  NEUROLOGIC: No overt focal deficit, no gait disturbance, speech is fluent. SKIN: Intact,warm,dry. PSYCH: Mood and behavior normal.  Lab Results  Component Value Date   NITRICOXIDE 18 03/24/2023    Assessment & Plan:     ICD-10-CM   1. Combined pulmonary fibrosis and emphysema (CPFE) (HCC)  J43.9 CT Chest High Resolution   J84.10 Pulmonary Function Test [facility] Only    2. Wheezing  R06.2 Nitric oxide    3. Shortness of breath  R06.02 CT Chest High Resolution    Pulmonary Function Test [facility] Only     Orders Placed This Encounter  Procedures   CT Chest High Resolution    Standing Status:   Future    Expiration Date:   03/23/2024    Preferred imaging location?:   [facility]   Nitric oxide   Pulmonary Function Test [facility] Only    Standing Status:   Future    Expected Date:   04/21/2023    Expiration Date:   03/23/2024    Full PFT: includes the following: basic spirometry, spirometry pre & post bronchodilator, diffusion capacity (DLCO), lung volumes:   Full PFT    This test can only be performed at:   [facility] ordered this encounter  Medications   Fluticasone-Umeclidin-Vilant (TRELEGY ELLIPTA) 100-62.5-25 MCG/ACT AEPB    Sig: Inhale 1 puff into the lungs every morning.    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 Reports  no current breathing difficulties and finds relief with Stiolto and an emergency inhaler. Examination reveals wheezing, and previous tests indicate airway inflammation (bronchial thickening on CT), though not classic asthma inflammation. Current medication, Stiolto, lacks an anti-inflammatory component. Trelegy, proposed as an alternative, contains an anti-inflammatory agent (ICS) and may alleviate symptoms and potentially reduce shortness of breath. - Discontinue Stiolto and initiate Trelegy once daily, ensuring to rinse mouth after use. - Provide a sample of Trelegy to last approximately one month. - Patient to report on efficacy of Trelegy. - Order high-resolution chest CT scan to characterize lung fibrosis better. - Schedule pulmonary function tests to assess lung function and compare with previous results. - Follow up in three months to evaluate the effectiveness of Trelegy and overall lung health.  Asthma/asthmatic bronchitis Asthma, currently inactive, with recent wheezing noted during examination. Previous tests do not indicate classic asthma inflammation, but airway inflammation is present. Trelegy, proposed as an alternative to [employer], may help manage symptoms due to its anti-inflammatory properties. - Monitor for changes in wheezing or respiratory symptoms with the new medication regimen.     Advised if symptoms do not improve or worsen, to please contact office for sooner follow up or seek emergency care.    I spent 42 minutes of dedicated to the care of this patient on the date of this encounter to include pre-visit review of records, face-to-face time with the patient discussing conditions above, post visit  ordering of testing, clinical documentation with the electronic health record, making appropriate referrals as documented, and communicating necessary findings to members of the patients care team.   C. Danice Goltz, MD Advanced Bronchoscopy PCCM [facility]    *This note was dictated using voice recognition software/Dragon.  Despite best efforts to proofread, errors can occur which can change the meaning. Any transcriptional errors that result from this process are unintentional and may not be fully corrected at the time of dictation.

## 2023-03-29 ENCOUNTER — Encounter: Payer: Self-pay | Admitting: Pulmonary Disease

## 2023-04-02 ENCOUNTER — Telehealth: Payer: Self-pay | Admitting: Pulmonary Disease

## 2023-04-02 MED ORDER — TRELEGY ELLIPTA 100-62.5-25 MCG/ACT IN AEPB
1.0000 | INHALATION_SPRAY | Freq: Every day | RESPIRATORY_TRACT | 3 refills | Status: AC
Start: 2023-04-02 — End: ?

## 2023-04-02 NOTE — Telephone Encounter (Signed)
 Pt states new med is working great. Asking if a rx for 3 mo supply can be [facility]

## 2023-04-02 NOTE — Telephone Encounter (Signed)
 I have sent in the prescription and notified the patient's wife (DPR).  Nothing further needed.

## 2023-05-06 ENCOUNTER — Telehealth: Payer: Self-pay

## 2023-05-06 NOTE — Telephone Encounter (Signed)
 I have spoke with Mrs. Antony and the patient's CT has been scheduled on 05/10/23 @ 10:00am at [facility]

## 2023-05-06 NOTE — Telephone Encounter (Signed)
 Copied from CRM [id number]. Topic: Clinical - Request for Lab/Test Order >> May 06, 2023  9:39 AM Para March B wrote: Reason for CRM: Patient returning a call to schedule CT.

## 2023-05-10 ENCOUNTER — Ambulatory Visit
Admission: RE | Admit: 2023-05-10 | Discharge: 2023-05-10 | Disposition: A | Discharge status: Home / Self Care | Source: Ambulatory Visit | Attending: Internal Medicine | Admitting: Internal Medicine

## 2023-05-10 DIAGNOSIS — R0602 Shortness of breath: Secondary | ICD-10-CM | POA: Insufficient documentation

## 2023-05-10 DIAGNOSIS — J439 Emphysema, unspecified: Secondary | ICD-10-CM | POA: Diagnosis present

## 2023-05-10 DIAGNOSIS — J8410 Pulmonary fibrosis, unspecified: Secondary | ICD-10-CM | POA: Diagnosis present

## 2023-06-23 ENCOUNTER — Ambulatory Visit: Payer: Medicare Other | Admitting: Pulmonary Disease

## 2023-06-23 ENCOUNTER — Encounter: Payer: Self-pay | Admitting: Pulmonary Disease

## 2023-06-23 VITALS — BP 170/88 | HR 60 | Temp 98.0°F | Ht 70.0 in | Wt 214.2 lb

## 2023-06-23 DIAGNOSIS — J439 Emphysema, unspecified: Secondary | ICD-10-CM | POA: Diagnosis not present

## 2023-06-23 DIAGNOSIS — J8410 Pulmonary fibrosis, unspecified: Secondary | ICD-10-CM

## 2023-06-23 DIAGNOSIS — R0602 Shortness of breath: Secondary | ICD-10-CM | POA: Diagnosis not present

## 2023-06-23 DIAGNOSIS — Z87891 Personal history of nicotine dependence: Secondary | ICD-10-CM

## 2023-06-23 DIAGNOSIS — R053 Chronic cough: Secondary | ICD-10-CM

## 2023-06-23 NOTE — Progress Notes (Signed)
 Subjective:    Patient ID: Jeralyn Mon, adult    DOB: 1935/12/28, 88 y.o.   MRN: [id number]  Patient Care Team: Jimmy Moulding, MD as PCP - General (Internal Medicine)  Chief Complaint  Patient presents with   Follow-up    Cough, shortness of breath and wheezing.     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 He was previously followed by Dr. Louie Rover and transition his care to me on 24 March 2023.  At that time PFTs and high-resolution chest CT were ordered.  Patient has not had PFTs thus far but did have high-resolution chest CT.  Not interested in antifibrotics.  HPI Discussed the use of AI scribe software for clinical note transcription with the patient, who gave verbal consent to proceed.  History of Present Illness   He is an 88 year old male with combined pulmonary fibrosis and emphysema syndrome who presents for follow-up.  He has a persistent cough producing yellow sputum, which persists despite his inhaler use.  Of note he is on an ACE inhibitor, we discussed the potential implications of this medication vis--vis his cough.  He alternates between two inhalers, one of which is a newer, more expensive medication (Trelegy). Due to cost concerns, he prefers to use up his remaining supply of the older inhaler (Stiolto) and then transition to the Trelegy completely. He experiences shortness of breath while playing golf.  But this is not a new issue and has not worsened.  He discusses his blood pressure medication, which he believes causes low energy and low blood pressure readings, sometimes in the forties. He takes two pills, one of which includes a diuretic that he feels contributes to feeling 'washed out.'   He has not had any chest pain, no orthopnea or paroxysmal nocturnal dyspnea.  He does not endorse any lower extremity edema.  He had a  high-resolution chest CT performed on 14 April, we discussed this in detail.  He has not had his PFTs as of yet.    DATA 03/06/2020 CT chest with contrast: Centrilobular emphysema and airway thickening, interstitial lung disease with fibrotic features.  No mediastinal adenopathy. 03/25/2022 PFTs: FEV1 2.29 L or 86% predicted, FVC 2.82 L or 74% predicted, FEV1/FVC 81% no bronchodilator response, mild reduction in lung volumes, mild diffusion capacity impairment which corrects to normal by alveolar volume. 05/10/2023 high-resolution chest CT: Spectrum of findings compatible with mild to moderate basilar predominant fibrotic interstitial lung disease with mild honeycombing, mildly worsened since 03/06/2020 CT.  Findings consistent with UIP per consensus guidelines.  Two-vessel coronary atherosclerosis, hypodense posterior right thyroid nodule  Review of Systems A 10 point review of systems was performed and it is as noted above otherwise negative.   Patient Active Problem List   Diagnosis Date Noted   Total knee replacement status 11/04/2022   IPF (idiopathic pulmonary fibrosis) (HCC) 03/25/2022   Aortic atherosclerosis (HCC) 03/08/2020   Recent URI 07/20/2018   Dermatochalasis of both upper eyelids 06/14/2018   Ptosis of eyelid 06/14/2018   Primary osteoarthritis of left knee 11/07/2017   Medial epicondylitis of elbow, right 12/18/2013   Hyperglycemia, unspecified 06/18/2013   Prostate cancer (HCC) 06/18/2013   Controlled type 2 diabetes mellitus with complication, without long-term current use of insulin (HCC) 06/18/2013   Male urinary stress incontinence 05/11/2013   Benign hypertension with chronic kidney disease, stage III (HCC) 01/04/2013  Cataract 01/04/2013   COPD with acute exacerbation (HCC) 01/04/2013   Hyperlipidemia 01/04/2013   Spermatocele of epididymis 08/17/2012   Bronchitis 03/09/2011    Social History   Tobacco Use   Smoking status: Former    Current packs/day:  0.00    Types: Cigarettes    Quit date: 08/19/1980    Years since quitting: 42.8   Smokeless tobacco: Never   Tobacco comments:    Started smoking at 20 years.     Smoked 2 PPD at his heaviest.    Quit smoking in 1982.  Khj 03/24/2023  Substance Use Topics   Alcohol  use: Yes    Alcohol /week: 1.0 standard drink of alcohol     Types: 1 Cans of beer per week    Comment: OCCAS    No Known Allergies  Current Meds  Medication Sig   acetaminophen  (TYLENOL ) 650 MG CR tablet Take 1,300 mg by mouth in the morning and at bedtime.    albuterol  (VENTOLIN  HFA) 108 (90 Base) MCG/ACT inhaler Inhale 2 puffs into the lungs every 6 (six) hours as needed for wheezing or shortness of breath.   aspirin  EC 81 MG tablet Take 2 tablets (162 mg total) by mouth in the morning and at bedtime. Swallow whole.   celecoxib  (CELEBREX ) 200 MG capsule Take 1 capsule (200 mg total) by mouth 2 (two) times daily.   CINNAMON PO Take 1,000 mg by mouth daily.   Cyanocobalamin (VITAMIN B-12) 2500 MCG SUBL Place 2,500 mcg under the tongue daily.   doxazosin  (CARDURA ) 8 MG tablet Take 8 mg by mouth at bedtime.    fluorouracil  (EFUDEX ) 5 % cream Apply topically 2 (two) times daily. Bid for 7 days to forehead to hairline, temples   fluticasone  (FLONASE ) 50 MCG/ACT nasal spray Place 1 spray into both nostrils daily.    Fluticasone -Umeclidin-Vilant (TRELEGY ELLIPTA ) 100-62.5-25 MCG/ACT AEPB Inhale 1 puff into the lungs daily.   lisinopril -hydrochlorothiazide  (PRINZIDE ,ZESTORETIC ) 20-12.5 MG per tablet Take 1 tablet by mouth daily.    oxyCODONE  (OXY IR/ROXICODONE ) 5 MG immediate release tablet Take 1 tablet (5 mg total) by mouth every 4 (four) hours as needed for moderate pain (pain score 4-6).   Propylene Glycol 0.6 % SOLN Place 1 drop into both eyes 2 (two) times daily.   simvastatin  (ZOCOR ) 40 MG tablet Take 40 mg by mouth at bedtime.    Spacer/Aero-Holding Chambers (AEROCHAMBER MV) inhaler Use as instructed   traMADol  (ULTRAM )  50 MG tablet Take 50 mg by mouth every morning.   traMADol  (ULTRAM ) 50 MG tablet Take 1-2 tablets (50-100 mg total) by mouth every 4 (four) hours as needed for moderate pain.   traZODone  (DESYREL ) 50 MG tablet Take 50 mg by mouth at bedtime.    Immunization History  Administered Date(s) Administered   Influenza Inj Mdck Quad Pf 10/14/2018, 11/19/2021   Influenza, High Dose Seasonal PF 10/22/2022   Influenza, Seasonal, Injecte, Preservative Fre 12/16/2004, 11/24/2009, 10/26/2011   Influenza,inj,Quad PF,6+ Mos 10/30/2013, 11/06/2014, 10/22/2015, 10/04/2019   Influenza-Unspecified 12/07/2012, 10/30/2013, 11/06/2014, 10/22/2015, 10/16/2016, 10/29/2017, 11/08/2020   Moderna SARS-COV2 Booster Vaccination 10/22/2022   Moderna Sars-Covid-2 Vaccination 02/07/2019, 03/07/2019   Pneumococcal Conjugate-13 10/30/2013   Zoster, Live 06/22/2012        Objective:     BP (!) 170/88 (BP Location: Left Arm, Patient Position: Sitting, Cuff Size: Normal)   Pulse 60   Temp 98 F (36.7 C) (Oral)   Ht 5\' 10"  (1.778 m)   Wt 214 lb 3.2 oz (97.2 kg)  SpO2 96%   BMI 30.73 kg/m   SpO2: 96 %  GENERAL: Well-developed, well-nourished elderly gentleman, age-appropriate, no acute distress, fully ambulatory.  Quite spry. HEAD: Normocephalic, atraumatic.  EYES: Pupils equal, round, reactive to light.  No scleral icterus.  MOUTH: Poor dentition, multiple chipped teeth, missing teeth.  Oral mucosa moist.  No thrush. NECK: Supple. No thyromegaly. Trachea midline. No JVD.  No adenopathy. PULMONARY: Good air entry bilaterally.  Faint bibasal crackles, coarse, faint end expiratory wheezes noted. CARDIOVASCULAR: S1 and S2. Regular rate and rhythm.  No rubs, murmurs or gallops heard. ABDOMEN: Benign. MUSCULOSKELETAL: No joint deformity, no clubbing, no edema.  NEUROLOGIC: No overt focal deficit, no gait disturbance, speech is fluent. SKIN: Intact,warm,dry. PSYCH: Mood and behavior normal.   Assessment & Plan:      ICD-10-CM   1. Combined pulmonary fibrosis and emphysema (CPFE) (HCC)  J43.9 Pulmonary function test   J84.10     2. Shortness of breath  R06.02 Pulmonary function test    3. Chronic cough  R05.3       Orders Placed This Encounter  Procedures   Pulmonary function test    Standing Status:   Future    Expected Date:   07/24/2023    Expiration Date:   06/22/2024    Where should this test be performed?:   [facility]    What type of PFT is being ordered?:   Full PFT   Discussion:    Combined pulmonary fibrosis and emphysema syndrome Mild progression of fibrosis on CT scan since 2022. Reports cough and yellow sputum. Alternating between two inhalers due to cost concerns. - Re order pulmonary function test to assess lung function - Allow use of current inhaler (Stiolto) for three months before switching back to the new inhaler (Trelegy) - Inform him about the cost and benefits of inhalers  Cough due to ACE inhibitor (suspected) Cough potentially caused and/or exacerbated by lisinopril . Discussed switching to a different antihypertensive to alleviate cough. Current medication may also contribute to fatigue and hypotension. - Sending note to primary care physician to consider switching from ACE inhibitor to another antihypertensive - Advise him to discuss antihypertensive medication with primary care physician during July appointment    Advised if symptoms do not improve or worsen, to please contact office for sooner follow up or seek emergency care.    I spent 32 minutes of dedicated to the care of this patient on the date of this encounter to include pre-visit review of records, face-to-face time with the patient discussing conditions above, post visit ordering of testing, clinical documentation with the electronic health record, making appropriate referrals as documented, and communicating necessary findings to members of the patients care team.     C. Chloe Counter,  MD Advanced Bronchoscopy PCCM [facility]    *This note was generated using voice recognition software/Dragon and/or AI transcription program.  Despite best efforts to proofread, errors can occur which can change the meaning. Any transcriptional errors that result from this process are unintentional and may not be fully corrected at the time of dictation.

## 2023-06-23 NOTE — Patient Instructions (Signed)
 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 We discussed your persistent cough, shortness of breath, and concerns about your blood pressure medication.  YOUR PLAN:  -COMBINED PULMONARY FIBROSIS AND EMPHYSEMA SYNDROME: This condition involves both scarring of the lung tissue and damage to the air sacs, making it difficult to breathe. Your CT scan shows mild progression of fibrosis since 2022. We will order a pulmonary function test to assess your lung function. You can continue using your current inhaler (Stiolto) for three months before switching back to the new one (Trelegy). We also discussed the cost and benefits of your inhalers.  -COUGH DUE TO ACE INHIBITOR: Your persistent cough may be caused or aggravated by your blood pressure medication, lisinopril . This medication can also contribute to fatigue and low blood pressure particularly when combined with a diuretic. We will send a note to your primary care physician to consider switching you to a different blood pressure medication. Please discuss this with your primary care physician during your July appointment.  Do not make any changes to your blood pressure medication without discussing with your primary physician first.  INSTRUCTIONS:  Please follow up with your primary care physician in July to discuss switching your blood pressure medication. We will also schedule a pulmonary function test to assess your lung function.  Will see you in follow-up in 4 months time call sooner should any new problems arise.

## 2023-07-07 ENCOUNTER — Ambulatory Visit: Payer: Medicare Other | Admitting: Dermatology

## 2023-07-07 ENCOUNTER — Encounter: Payer: Self-pay | Admitting: Dermatology

## 2023-07-07 DIAGNOSIS — W908XXA Exposure to other nonionizing radiation, initial encounter: Secondary | ICD-10-CM

## 2023-07-07 DIAGNOSIS — D492 Neoplasm of unspecified behavior of bone, soft tissue, and skin: Secondary | ICD-10-CM | POA: Diagnosis not present

## 2023-07-07 DIAGNOSIS — L820 Inflamed seborrheic keratosis: Secondary | ICD-10-CM

## 2023-07-07 DIAGNOSIS — L570 Actinic keratosis: Secondary | ICD-10-CM | POA: Diagnosis not present

## 2023-07-07 DIAGNOSIS — L578 Other skin changes due to chronic exposure to nonionizing radiation: Secondary | ICD-10-CM | POA: Diagnosis not present

## 2023-07-07 DIAGNOSIS — L821 Other seborrheic keratosis: Secondary | ICD-10-CM

## 2023-07-07 DIAGNOSIS — C4491 Basal cell carcinoma of skin, unspecified: Secondary | ICD-10-CM

## 2023-07-07 DIAGNOSIS — D489 Neoplasm of uncertain behavior, unspecified: Secondary | ICD-10-CM

## 2023-07-07 DIAGNOSIS — L814 Other melanin hyperpigmentation: Secondary | ICD-10-CM

## 2023-07-07 DIAGNOSIS — C44319 Basal cell carcinoma of skin of other parts of face: Secondary | ICD-10-CM

## 2023-07-07 HISTORY — DX: Basal cell carcinoma of skin, unspecified: C44.91

## 2023-07-07 NOTE — Progress Notes (Signed)
 Follow-Up Visit   Subjective  Scott Branch is a 88 y.o. adult who presents for the following: 6 month ak follow up, hx of aks and isks, reports some spots at right ear and left preauricular area he would like checked.   The patient has spots, moles and lesions to be evaluated, some may be new or changing and the patient may have concern these could be cancer.  The following portions of the chart were reviewed this encounter and updated as appropriate: medications, allergies, medical history  Review of Systems:  No other skin or systemic complaints except as noted in HPI or Assessment and Plan.  Objective  Well appearing patient in no apparent distress; mood and affect are within normal limits.  A focused examination was performed of the following areas: Face, ears, scalp  Relevant exam findings are noted in the Assessment and Plan.  face and ears x 9 (9) Erythematous thin papules/macules with gritty scale.  right ear and right temple x 3 (3) Erythematous stuck-on, waxy papule or plaque Left Preauricular Area 1.5 x 1.0 cm plaque with 3 papules within    Assessment & Plan    ACTINIC DAMAGE - chronic, secondary to cumulative UV radiation exposure/sun exposure over time - diffuse scaly erythematous macules with underlying dyspigmentation - Recommend daily broad spectrum sunscreen SPF 30+ to sun-exposed areas, reapply every 2 hours as needed.  - Recommend staying in the shade or wearing long sleeves, sun glasses (UVA+UVB protection) and wide brim hats (4-inch brim around the entire circumference of the hat). - Call for new or changing lesions.  ACTINIC KERATOSIS (9) face and ears x 9 (9) Actinic keratoses are precancerous spots that appear secondary to cumulative UV radiation exposure/sun exposure over time. They are chronic with expected duration over 1 year. A portion of actinic keratoses will progress to squamous cell carcinoma of the skin. It is not possible to  reliably predict which spots will progress to skin cancer and so treatment is recommended to prevent development of skin cancer.  Recommend daily broad spectrum sunscreen SPF 30+ to sun-exposed areas, reapply every 2 hours as needed.  Recommend staying in the shade or wearing long sleeves, sun glasses (UVA+UVB protection) and wide brim hats (4-inch brim around the entire circumference of the hat). Call for new or changing lesions. Destruction of lesion - face and ears x 9 (9) Complexity: simple   Destruction method: cryotherapy   Informed consent: discussed and consent obtained   Timeout:  patient name, date of birth, surgical site, and procedure verified Lesion destroyed using liquid nitrogen: Yes   Region frozen until ice ball extended beyond lesion: Yes   Outcome: patient tolerated procedure well with no complications   Post-procedure details: wound care instructions given   INFLAMED SEBORRHEIC KERATOSIS (3) right ear and right temple x 3 (3) Symptomatic, irritating, patient would like treated. Destruction of lesion - right ear and right temple x 3 (3) Complexity: simple   Destruction method: cryotherapy   Informed consent: discussed and consent obtained   Timeout:  patient name, date of birth, surgical site, and procedure verified Lesion destroyed using liquid nitrogen: Yes   Region frozen until ice ball extended beyond lesion: Yes   Outcome: patient tolerated procedure well with no complications   Post-procedure details: wound care instructions given   NEOPLASM OF UNCERTAIN BEHAVIOR Left Preauricular Area Skin / nail biopsy Type of biopsy: tangential   Informed consent: discussed and consent obtained   Timeout: patient name, date  of birth, surgical site, and procedure verified   Patient was prepped and draped in usual sterile fashion: Area prepped with alcohol . Anesthesia: the lesion was anesthetized in a standard fashion   Anesthetic:  1% lidocaine  w/ epinephrine  1-100,000  buffered w/ 8.4% NaHCO3 Instrument used: flexible razor blade   Hemostasis achieved with: pressure, aluminum chloride and electrodesiccation   Outcome: patient tolerated procedure well   Post-procedure details: wound care instructions given   Post-procedure details comment:  Ointment and small bandage applied Specimen 1 - Surgical pathology Differential Diagnosis: r/o bcc  Check Margins: No R/o bcc   Dr. Felipe Horton and Dr. Annette Barters both agree it is likely Scott Branch and will do biopsy today. ACTINIC SKIN DAMAGE   SEBORRHEIC KERATOSIS    LENTIGINES Exam: scattered tan macules Due to sun exposure Treatment Plan: Benign-appearing, observe. Recommend daily broad spectrum sunscreen SPF 30+ to sun-exposed areas, reapply every 2 hours as needed.  Call for any changes  SEBORRHEIC KERATOSIS - Stuck-on, waxy, tan-brown papules and/or plaques  - Benign-appearing - Discussed benign etiology and prognosis. - Observe - Call for any changes  Return for 9 month ak and isk follow up.  IRandee Busing, CMA, am acting as scribe for Celine Collard, MD.   Documentation: I have reviewed the above documentation for accuracy and completeness, and I agree with the above.  Celine Collard, MD

## 2023-07-07 NOTE — Patient Instructions (Addendum)
Biopsy Wound Care Instructions  Leave the original bandage on for 24 hours if possible.  If the bandage becomes soaked or soiled before that time, it is OK to remove it and examine the wound.  A small amount of post-operative bleeding is normal.  If excessive bleeding occurs, remove the bandage, place gauze over the site and apply continuous pressure (no peeking) over the area for 30 minutes. If this does not work, please call our clinic as soon as possible or page your doctor if it is after hours.   Once a day, cleanse the wound with soap and water. It is fine to shower. If a thick crust develops you may use a Q-tip dipped into dilute hydrogen peroxide (mix 1:1 with water) to dissolve it.  Hydrogen peroxide can slow the healing process, so use it only as needed.    After washing, apply petroleum jelly (Vaseline) or an antibiotic ointment if your doctor prescribed one for you, followed by a bandage.    For best healing, the wound should be covered with a layer of ointment at all times. If you are not able to keep the area covered with a bandage to hold the ointment in place, this may mean re-applying the ointment several times a day.  Continue this wound care until the wound has healed and is no longer open.   Itching and mild discomfort is normal during the healing process. However, if you develop pain or severe itching, please call our office.   If you have any discomfort, you can take Tylenol (acetaminophen) or ibuprofen as directed on the bottle. (Please do not take these if you have an allergy to them or cannot take them for another reason).  Some redness, tenderness and white or yellow material in the wound is normal healing.  If the area becomes very sore and red, or develops a thick yellow-green material (pus), it may be infected; please notify us.    If you have stitches, return to clinic as directed to have the stitches removed. You will continue wound care for 2-3 days after the stitches  are removed.   Wound healing continues for up to one year following surgery. It is not unusual to experience pain in the scar from time to time during the interval.  If the pain becomes severe or the scar thickens, you should notify the office.    A slight amount of redness in a scar is expected for the first six months.  After six months, the redness will fade and the scar will soften and fade.  The color difference becomes less noticeable with time.  If there are any problems, return for a post-op surgery check at your earliest convenience.  To improve the appearance of the scar, you can use silicone scar gel, cream, or sheets (such as Mederma or Serica) every night for up to one year. These are available over the counter (without a prescription).  Please call our office at [phone] for any questions or concerns.    Actinic keratoses are precancerous spots that appear secondary to cumulative UV radiation exposure/sun exposure over time. They are chronic with expected duration over 1 year. A portion of actinic keratoses will progress to squamous cell carcinoma of the skin. It is not possible to reliably predict which spots will progress to skin cancer and so treatment is recommended to prevent development of skin cancer.  Recommend daily broad spectrum sunscreen SPF 30+ to sun-exposed areas, reapply every 2 hours as needed.  Recommend staying in the shade or wearing long sleeves, sun glasses (UVA+UVB protection) and wide brim hats (4-inch brim around the entire circumference of the hat). Call for new or changing lesions.    Cryotherapy Aftercare  Wash gently with soap and water everyday.   Apply Vaseline and Band-Aid daily until healed.      Seborrheic Keratosis  What causes seborrheic keratoses? Seborrheic keratoses are harmless, common skin growths that first appear during adult life.  As time goes by, more growths appear.  Some people may develop a large number of them.   Seborrheic keratoses appear on both covered and uncovered body parts.  They are not caused by sunlight.  The tendency to develop seborrheic keratoses can be inherited.  They vary in color from skin-colored to gray, brown, or even black.  They can be either smooth or have a rough, warty surface.   Seborrheic keratoses are superficial and look as if they were stuck on the skin.  Under the microscope this type of keratosis looks like layers upon layers of skin.  That is why at times the top layer may seem to fall off, but the rest of the growth remains and re-grows.    Treatment Seborrheic keratoses do not need to be treated, but can easily be removed in the office.  Seborrheic keratoses often cause symptoms when they rub on clothing or jewelry.  Lesions can be in the way of shaving.  If they become inflamed, they can cause itching, soreness, or burning.  Removal of a seborrheic keratosis can be accomplished by freezing, burning, or surgery. If any spot bleeds, scabs, or grows rapidly, please return to have it checked, as these can be an indication of a skin cancer.   Due to recent changes in healthcare laws, you may see results of your pathology and/or laboratory studies on MyChart before the doctors have had a chance to review them. We understand that in some cases there may be results that are confusing or concerning to you. Please understand that not all results are received at the same time and often the doctors may need to interpret multiple results in order to provide you with the best plan of care or course of treatment. Therefore, we ask that you please give [place] 2 business days to thoroughly review all your results before contacting the office for clarification. Should we see a critical lab result, you will be contacted sooner.   If You Need Anything After Your Visit  If you have any questions or concerns for your doctor, please call our main line at [phone] and press option 4 to reach your  doctor's medical assistant. If no one answers, please leave a voicemail as directed and we will return your call as soon as possible. Messages left after 4 pm will be answered the following business day.   You may also send [place] a message via MyChart. We typically respond to MyChart messages within 1-2 business days.  For prescription refills, please ask your pharmacy to contact our office. Our fax number is [phone].  If you have an urgent issue when the clinic is closed that cannot wait until the next business day, you can page your doctor at the number below.    Please note that while we do our best to be available for urgent issues outside of office hours, we are not available 24/7.   If you have an urgent issue and are unable to reach [place], you may choose to seek medical  care at your doctor's office, retail clinic, urgent care center, or emergency room.  If you have a medical emergency, please immediately call 911 or go to the emergency department.  Pager Numbers  - Dr. Gwen Pounds: [phone]  - Dr. Roseanne Reno: [phone]  - Dr. Katrinka Blazing: [phone]   In the event of inclement weather, please call our main line at [phone] for an update on the status of any delays or closures.  Dermatology Medication Tips: Please keep the boxes that topical medications come in in order to help keep track of the instructions about where and how to use these. Pharmacies typically print the medication instructions only on the boxes and not directly on the medication tubes.   If your medication is too expensive, please contact our office at [phone] option 4 or send [place] a message through MyChart.   We are unable to tell what your co-pay for medications will be in advance as this is different depending on your insurance coverage. However, we may be able to find a substitute medication at lower cost or fill out paperwork to get insurance to cover a needed medication.   If a prior authorization is  required to get your medication covered by your insurance company, please allow [place] 1-2 business days to complete this process.  Drug prices often vary depending on where the prescription is filled and some pharmacies may offer cheaper prices.  The website www.goodrx.com contains coupons for medications through different pharmacies. The prices here do not account for what the cost may be with help from insurance (it may be cheaper with your insurance), but the website can give you the price if you did not use any insurance.  - You can print the associated coupon and take it with your prescription to the pharmacy.  - You may also stop by our office during regular business hours and pick up a GoodRx coupon card.  - If you need your prescription sent electronically to a different pharmacy, notify our office through [facility] or by phone at [phone] option 4.     Si Usted Necesita Algo Despus de Su Visita  Tambin puede enviarnos un mensaje a travs de Clinical cytogeneticist. Por lo general respondemos a los mensajes de MyChart en el transcurso de 1 a 2 das hbiles.  Para renovar recetas, por favor pida a su farmacia que se ponga en contacto con nuestra oficina. Annie Sable de fax es [place] [phone].  Si tiene un asunto urgente cuando la clnica est cerrada y que no puede esperar hasta el siguiente da hbil, puede llamar/localizar a su doctor(a) al nmero que aparece a continuacin.   Por favor, tenga en cuenta que aunque hacemos todo lo posible para estar disponibles para asuntos urgentes fuera del horario de [place], no estamos disponibles las 24 horas del da, los 7 [address]  [address] de la [place].   Si tiene un problema urgente y no puede comunicarse con nosotros, puede optar por buscar atencin mdica  en el consultorio de su doctor(a), en una clnica privada, en un centro de atencin urgente o en una sala de emergencias.  Si tiene Engineer, drilling, por favor llame inmediatamente al 911 o vaya a  la sala de emergencias.  Nmeros de bper  - Dr. Gwen Pounds: [phone]  - Dra. Roseanne Reno: [phone]  - Dr. Katrinka Blazing: [phone]   En caso de inclemencias del tiempo, por favor llame a Lacy Duverney principal al [phone] para una actualizacin sobre el [place] de cualquier retraso o cierre.  Consejos para  la medicacin en dermatologa: Por favor, guarde las cajas en las que vienen los medicamentos de uso tpico para ayudarle a seguir las instrucciones sobre dnde y cmo usarlos. Las farmacias generalmente imprimen las instrucciones del medicamento slo en las cajas y no directamente en los tubos del [place].   Si su medicamento es muy caro, por favor, pngase en contacto con Rolm Gala llamando al [phone] y presione la opcin 4 o envenos un mensaje a travs de Clinical cytogeneticist.   No podemos decirle cul ser su copago por los medicamentos por adelantado ya que esto es diferente dependiendo de la cobertura de su seguro. Sin embargo, es posible que podamos encontrar un medicamento sustituto a Audiological scientist un formulario para que el seguro cubra el medicamento que se considera necesario.   Si se requiere una autorizacin previa para que su compaa de seguros [place] su medicamento, por favor permtanos de 1 a 2 das hbiles para completar [address]  Los precios de los medicamentos varan con frecuencia dependiendo del Environmental consultant de dnde se surte la receta y alguna farmacias pueden ofrecer precios ms baratos.  El sitio web www.goodrx.com tiene cupones para medicamentos de Health and safety inspector. Los precios aqu no tienen en cuenta lo que podra costar con la ayuda del seguro (puede ser ms barato con su seguro), pero el sitio web puede darle el precio si no utiliz Tourist information centre manager.  - Puede imprimir el cupn correspondiente y llevarlo con su receta a la farmacia.  - Tambin puede pasar por nuestra oficina durante el horario de atencin regular y Education officer, museum una tarjeta de cupones de  GoodRx.  - Si necesita que su receta se enve electrnicamente a una farmacia diferente, informe a nuestra oficina a travs de MyChart de [place] o por telfono llamando al [phone] y presione la opcin 4.

## 2023-07-08 LAB — SURGICAL PATHOLOGY

## 2023-07-09 ENCOUNTER — Ambulatory Visit: Payer: Self-pay | Admitting: Dermatology

## 2023-07-12 ENCOUNTER — Encounter: Payer: Self-pay | Admitting: Dermatology

## 2023-07-12 NOTE — Telephone Encounter (Signed)
 Advised Scott Branch of bx results.  Discussed excision with Dr. Annette Barters or Dr. Fain Home.  Patient prefers to have surgery at our office with Dr. Annette Barters.  Scheduled Scott Branch for surgery 08/11/23 at 11:30./sh

## 2023-07-12 NOTE — Telephone Encounter (Signed)
-----   Message from Celine Collard sent at 07/09/2023  2:37 PM EDT ----- FINAL DIAGNOSIS        1. Skin, left preauricular area :       BASAL CELL CARCINOMA, NODULAR PATTERN, BASE INVOLVED   Cancer = BCC Schedule for surgery with Dr Annette Barters  or Dr Fain Home ----- Message ----- From: Interface, Lab In [place] Sent: 07/08/2023   5:45 PM EDT To: Elta Halter, MD

## 2023-08-02 ENCOUNTER — Encounter: Payer: Self-pay | Admitting: Dermatology

## 2023-08-02 ENCOUNTER — Ambulatory Visit (INDEPENDENT_AMBULATORY_CARE_PROVIDER_SITE_OTHER): Admitting: Dermatology

## 2023-08-02 DIAGNOSIS — C44319 Basal cell carcinoma of skin of other parts of face: Secondary | ICD-10-CM | POA: Diagnosis not present

## 2023-08-02 NOTE — Patient Instructions (Signed)

## 2023-08-02 NOTE — Progress Notes (Signed)
   Follow-Up Visit   Subjective  Scott Branch is a 88 y.o. adult who presents for the following: BCC bx proven L preauricular, pt presents for excision   The following portions of the chart were reviewed this encounter and updated as appropriate: medications, allergies, medical history  Review of Systems:  No other skin or systemic complaints except as noted in HPI or Assessment and Plan.  Objective  Well appearing patient in no apparent distress; mood and affect are within normal limits.   A focused examination was performed of the following areas: face  Relevant exam findings are noted in the Assessment and Plan.  L preauricular Pink bx site 1.6 x 1.0cm   Assessment & Plan     BASAL CELL CARCINOMA (BCC) OF SKIN OF OTHER PART OF FACE L preauricular Skin excision  Excision method:  elliptical Lesion length (cm):  1.6 Lesion width (cm):  1 Margin per side (cm):  0.2 Total excision diameter (cm):  2 Informed consent: discussed and consent obtained   Timeout: patient name, date of birth, surgical site, and procedure verified   Procedure prep:  Patient was prepped and draped in usual sterile fashion Prep type:  Povidone-iodine Anesthesia: the lesion was anesthetized in a standard fashion   Anesthetic:  1% lidocaine  w/ epinephrine  1-100,000 buffered w/ 8.4% NaHCO3 (9cc lido w/ epi, 3cc bupivicaine, total of 12cc) Instrument used comment:  #15c blade Hemostasis achieved with: pressure and electrodesiccation   Outcome: patient tolerated procedure well with no complications   Additional details:  Tagged superior 12 o'clock tip   Skin repair Complexity:  Complex Final length (cm):  5.2 Informed consent: discussed and consent obtained   Reason for type of repair: reduce tension to allow closure, reduce the risk of dehiscence, infection, and necrosis, reduce subcutaneous dead space and avoid a hematoma, allow closure of the large defect, preserve normal anatomical and  functional relationships and enhance both functionality and cosmetic results   Undermining: area extensively undermined   Undermining comment:  2 cm Subcutaneous layers (deep stitches):  Suture size:  5-0 Suture type: Vicryl (polyglactin 910)   Subcutaneous suture technique: inverted dermal. Fine/surface layer approximation (top stitches):  Suture size:  5-0 Suture type: nylon   Stitches: simple interrupted   Suture removal (days):  7 Hemostasis achieved with: suture Outcome: patient tolerated procedure well with no complications   Post-procedure details: sterile dressing applied and wound care instructions given   Dressing type: pressure dressing (Mupirocin)    Specimen 1 - Surgical pathology Differential Diagnosis: Bx proven BCC Tagged superior 12 o'clock tip  Check Margins: yes Pink bx site [phone]   Return in about 1 week (around 08/09/2023) for suture removal.  I, Sonya Hupman, RMA, am acting as scribe for Rexene Rattler, MD .   Documentation: I have reviewed the above documentation for accuracy and completeness, and I agree with the above.  Rexene Rattler, MD

## 2023-08-03 ENCOUNTER — Ambulatory Visit: Payer: Self-pay | Admitting: Dermatology

## 2023-08-03 ENCOUNTER — Telehealth: Payer: Self-pay

## 2023-08-03 LAB — SURGICAL PATHOLOGY

## 2023-08-03 NOTE — Telephone Encounter (Signed)
Patient doing fine after yesterdays surgery./sh 

## 2023-08-09 ENCOUNTER — Encounter: Payer: Self-pay | Admitting: Dermatology

## 2023-08-09 ENCOUNTER — Ambulatory Visit (INDEPENDENT_AMBULATORY_CARE_PROVIDER_SITE_OTHER): Admitting: Dermatology

## 2023-08-09 DIAGNOSIS — C44319 Basal cell carcinoma of skin of other parts of face: Secondary | ICD-10-CM

## 2023-08-09 DIAGNOSIS — Z48817 Encounter for surgical aftercare following surgery on the skin and subcutaneous tissue: Secondary | ICD-10-CM

## 2023-08-09 DIAGNOSIS — Z85828 Personal history of other malignant neoplasm of skin: Secondary | ICD-10-CM

## 2023-08-09 NOTE — Patient Instructions (Signed)

## 2023-08-09 NOTE — Progress Notes (Signed)
   Follow-Up Visit   Subjective  Scott Branch is a 88 y.o. adult who presents for the following: Suture removal  Pathology showed BCC, margins free at left preauricular.  The following portions of the chart were reviewed this encounter and updated as appropriate: medications, allergies, medical history  Review of Systems:  No other skin or systemic complaints except as noted in HPI or Assessment and Plan.  Objective  Well appearing patient in no apparent distress; mood and affect are within normal limits.  Areas Examined: Face  Relevant physical exam findings are noted in the Assessment and Plan.    Assessment & Plan    Encounter for Removal of Sutures - Incision site is clean, dry and intact. - Wound cleansed, sutures removed, wound cleansed and steri strips applied.  - Discussed pathology results showing BCC, margins free at left preauricular - Patient advised to keep steri-strips dry until they fall off. - Scars remodel for a full year. - Once steri-strips fall off, patient can apply over-the-counter silicone scar cream once to twice a day to help with scar remodeling if desired. - Patient advised to call with any concerns or if they notice any new or changing lesions.  Return as scheduled with Dr LOIS FERNS, Andrea Kerns, CMA, am acting as scribe for Rexene Rattler, MD .   Documentation: I have reviewed the above documentation for accuracy and completeness, and I agree with the above.  Rexene Rattler, MD

## 2023-08-11 ENCOUNTER — Encounter: Admitting: Dermatology

## 2023-10-27 ENCOUNTER — Ambulatory Visit: Admitting: Pulmonary Disease

## 2023-10-27 ENCOUNTER — Encounter: Payer: Self-pay | Admitting: Pulmonary Disease

## 2023-10-27 VITALS — BP 140/66 | HR 77 | Temp 97.6°F | Ht 70.0 in | Wt 211.8 lb

## 2023-10-27 DIAGNOSIS — Z23 Encounter for immunization: Secondary | ICD-10-CM

## 2023-10-27 DIAGNOSIS — J8410 Pulmonary fibrosis, unspecified: Secondary | ICD-10-CM

## 2023-10-27 DIAGNOSIS — J439 Emphysema, unspecified: Secondary | ICD-10-CM

## 2023-10-27 DIAGNOSIS — R0602 Shortness of breath: Secondary | ICD-10-CM

## 2023-10-27 LAB — PULMONARY FUNCTION TEST
DL/VA % pred: 79 %
DL/VA: 2.97 ml/min/mmHg/L
DLCO unc % pred: 56 %
DLCO unc: 13.08 ml/min/mmHg
FEF 25-75 Post: 2.66 L/s
FEF 25-75 Pre: 2.46 L/s
FEF2575-%Change-Post: 8 %
FEF2575-%Pred-Post: 158 %
FEF2575-%Pred-Pre: 146 %
FEV1-%Change-Post: 2 %
FEV1-%Pred-Post: 96 %
FEV1-%Pred-Pre: 93 %
FEV1-Post: 2.52 L
FEV1-Pre: 2.46 L
FEV1FVC-%Change-Post: 0 %
FEV1FVC-%Pred-Pre: 113 %
FEV6-%Change-Post: 2 %
FEV6-%Pred-Post: 90 %
FEV6-%Pred-Pre: 88 %
FEV6-Post: 3.14 L
FEV6-Pre: 3.08 L
FEV6FVC-%Change-Post: 0 %
FEV6FVC-%Pred-Post: 108 %
FEV6FVC-%Pred-Pre: 107 %
FVC-%Change-Post: 2 %
FVC-%Pred-Post: 83 %
FVC-%Pred-Pre: 82 %
FVC-Post: 3.16 L
FVC-Pre: 3.09 L
Post FEV1/FVC ratio: 80 %
Post FEV6/FVC ratio: 100 %
Pre FEV1/FVC ratio: 80 %
Pre FEV6/FVC Ratio: 100 %
RV % pred: 53 %
RV: 1.5 L
TLC % pred: 65 %
TLC: 4.62 L

## 2023-10-27 NOTE — Progress Notes (Signed)
 Subjective:    Patient ID: Scott Branch, adult    DOB: Oct 14, 1935, 88 y.o.   MRN: [id number]  Patient Care Team: Lenon Layman ORN, MD as PCP - General (Internal Medicine)  Chief Complaint  Patient presents with   Medical Management of Chronic Issues    Cough. Shortness of breath on exertion. No wheezing.     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 He was previously followed by Dr. Verdon Gore and transition his care to me on 24 March 2023.  He was last seen on 21 Jun 2023.  HPI Discussed the use of AI scribe software for clinical note transcription with the patient, who gave verbal consent to proceed.  History of Present Illness   Scott Branch is an 88 year old male with combined pulmonary fibrosis and emphysema who presents for follow-up.  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 He describes his shortness of breath as manageable, stating 'I'm all right.'  He was previously on Stiolto but has transitioned now to Trelegy Ellipta  100.  He has not yet received his flu vaccine, this was offered to him today and he wishes to go ahead. He has previously received COVID vaccinations and has not contracted COVID-19.   We discussed his lung function today.  He has mild to moderate reduction in lung volumes.  Overall his lung function is stable.    DATA 03/06/2020 CT chest with contrast: Centrilobular emphysema and airway thickening, interstitial lung disease with fibrotic features.  No mediastinal adenopathy. 03/25/2022 PFTs: FEV1 2.29 L or 86% predicted, FVC 2.82 L or 74% predicted, FEV1/FVC 81% no bronchodilator response, mild reduction in lung volumes, mild diffusion capacity impairment which corrects to normal by alveolar volume. 05/10/2023 high-resolution chest CT: Spectrum of findings  compatible with mild to moderate basilar predominant fibrotic interstitial lung disease with mild honeycombing, mildly worsened since 03/06/2020 CT.  Findings consistent with UIP per consensus guidelines.  Two-vessel coronary atherosclerosis, hypodense posterior right thyroid nodule 10/27/2023 PFTs: FEV1 2.46 L or 93% predicted, FVC 3.09 L or 82% predicted, FEV1/FVC 80%, lung volumes mildly to moderately reduced.  Diffusion capacity mildly to moderately reduced.  Review of Systems A 10 point review of systems was performed and it is as noted above otherwise negative.   Patient Active Problem List   Diagnosis Date Noted   Total knee replacement status 11/04/2022   IPF (idiopathic pulmonary fibrosis) (HCC) 03/25/2022   Aortic atherosclerosis 03/08/2020   Recent URI 07/20/2018   Dermatochalasis of both upper eyelids 06/14/2018   Ptosis of eyelid 06/14/2018   Primary osteoarthritis of left knee 11/07/2017   Medial epicondylitis of elbow, right 12/18/2013   Hyperglycemia, unspecified 06/18/2013   Prostate cancer (HCC) 06/18/2013   Controlled type 2 diabetes mellitus with complication, without long-term current use of insulin (HCC) 06/18/2013   Male urinary stress incontinence 05/11/2013   Benign hypertension with chronic kidney disease, stage III (HCC) 01/04/2013   Cataract 01/04/2013   COPD with acute exacerbation (HCC) 01/04/2013   Hyperlipidemia 01/04/2013   Spermatocele of epididymis 08/17/2012   Bronchitis 03/09/2011    Social History   Tobacco Use   Smoking status: Former    Current packs/day: 0.00    Types: Cigarettes    Quit date: 08/19/1980    Years since quitting: 43.2   Smokeless tobacco: Never   Tobacco comments:  Started smoking at 20 years.     Smoked 2 PPD at his heaviest.    Quit smoking in 1982.  Khj 03/24/2023  Substance Use Topics   Alcohol  use: Yes    Alcohol /week: 1.0 standard drink of alcohol     Types: 1 Cans of beer per week    Comment: OCCAS    No  Known Allergies  Current Meds  Medication Sig   acetaminophen  (TYLENOL ) 650 MG CR tablet Take 1,300 mg by mouth in the morning and at bedtime.    albuterol  (VENTOLIN  HFA) 108 (90 Base) MCG/ACT inhaler Inhale 2 puffs into the lungs every 6 (six) hours as needed for wheezing or shortness of breath.   aspirin  EC 81 MG tablet Take 2 tablets (162 mg total) by mouth in the morning and at bedtime. Swallow whole.   celecoxib  (CELEBREX ) 200 MG capsule Take 1 capsule (200 mg total) by mouth 2 (two) times daily.   CINNAMON PO Take 1,000 mg by mouth daily.   Cyanocobalamin (VITAMIN B-12) 2500 MCG SUBL Place 2,500 mcg under the tongue daily.   doxazosin  (CARDURA ) 8 MG tablet Take 8 mg by mouth at bedtime.    fluorouracil  (EFUDEX ) 5 % cream Apply topically 2 (two) times daily. Bid for 7 days to forehead to hairline, temples   fluticasone  (FLONASE ) 50 MCG/ACT nasal spray Place 1 spray into both nostrils daily.    Fluticasone -Umeclidin-Vilant (TRELEGY ELLIPTA ) 100-62.5-25 MCG/ACT AEPB Inhale 1 puff into the lungs daily.   lisinopril -hydrochlorothiazide  (PRINZIDE ,ZESTORETIC ) 20-12.5 MG per tablet Take 1 tablet by mouth daily.    oxyCODONE  (OXY IR/ROXICODONE ) 5 MG immediate release tablet Take 1 tablet (5 mg total) by mouth every 4 (four) hours as needed for moderate pain (pain score 4-6).   Propylene Glycol 0.6 % SOLN Place 1 drop into both eyes 2 (two) times daily.   simvastatin  (ZOCOR ) 40 MG tablet Take 40 mg by mouth at bedtime.    Spacer/Aero-Holding Chambers (AEROCHAMBER MV) inhaler Use as instructed   traMADol  (ULTRAM ) 50 MG tablet Take 50 mg by mouth every morning.   traMADol  (ULTRAM ) 50 MG tablet Take 1-2 tablets (50-100 mg total) by mouth every 4 (four) hours as needed for moderate pain.   traZODone  (DESYREL ) 50 MG tablet Take 50 mg by mouth at bedtime.    Immunization History  Administered Date(s) Administered   INFLUENZA, HIGH DOSE SEASONAL PF 10/22/2022, 10/27/2023   Influenza Inj Mdck Quad Pf  10/14/2018, 11/19/2021   Influenza, Seasonal, Injecte, Preservative Fre 12/16/2004, 11/24/2009, 10/26/2011   Influenza,inj,Quad PF,6+ Mos 10/30/2013, 11/06/2014, 10/22/2015, 10/04/2019   Influenza-Unspecified 12/07/2012, 10/30/2013, 11/06/2014, 10/22/2015, 10/16/2016, 10/29/2017, 11/08/2020   Moderna SARS-COV2 Booster Vaccination 10/22/2022   Moderna Sars-Covid-2 Vaccination 02/07/2019, 03/07/2019   Pneumococcal Conjugate-13 10/30/2013   Zoster, Live 06/22/2012        Objective:     BP (!) 140/66   Pulse 77   Temp 97.6 F (36.4 C) (Temporal)   Ht 5' 10 (1.778 m)   Wt 211 lb 12.8 oz (96.1 kg)   SpO2 95%   BMI 30.39 kg/m   SpO2: 95 %  GENERAL: Well-developed, well-nourished elderly gentleman, age-appropriate, no acute distress, fully ambulatory.  Quite spry. HEAD: Normocephalic, atraumatic.  EYES: Pupils equal, round, reactive to light.  No scleral icterus.  MOUTH: Poor dentition, multiple chipped teeth, missing teeth.  Oral mucosa moist.  No thrush. NECK: Supple. No thyromegaly. Trachea midline. No JVD.  No adenopathy. PULMONARY: Good air entry bilaterally.  Faint bibasal crackles, coarse, no wheezes  noted. CARDIOVASCULAR: S1 and S2. Regular rate and rhythm.  No rubs, murmurs or gallops heard. ABDOMEN: Benign. MUSCULOSKELETAL: No joint deformity, no clubbing, no edema.  NEUROLOGIC: No overt focal deficit, no gait disturbance, speech is fluent. SKIN: Intact,warm,dry. PSYCH: Mood and behavior normal.   Assessment & Plan:     ICD-10-CM   1. Combined pulmonary fibrosis and emphysema (CPFE) (HCC)  J43.9    J84.10     2. Shortness of breath  R06.02     3. Need for influenza vaccination  Z23 Flu vaccine HIGH DOSE PF(Fluzone Trivalent)      Orders Placed This Encounter  Procedures   Flu vaccine HIGH DOSE PF(Fluzone Trivalent)   Discussion:    Combined pulmonary fibrosis and emphysema (CPFE) Lung function is well-managed with preserved numbers, though lung volume is  slightly reduced due to scarring. He is on Trelegy.  - Continue Trelegy as prescribed. - Ensure mouth rinsing after using Trelegy. - Monitor response to Trelegy and report any issues. - Scheduled follow-up in four months to assess progress.  Influenza vaccination, encounter for He plans to receive the influenza vaccine shortly and is open to receiving it today. - Administered influenza vaccine today.     Advised if symptoms do not improve or worsen, to please contact office for sooner follow up or seek emergency care.    I spent 31 minutes of dedicated to the care of this patient on the date of this encounter to include pre-visit review of records, face-to-face time with the patient discussing conditions above, post visit ordering of testing, clinical documentation with the electronic health record, making appropriate referrals as documented, and communicating necessary findings to members of the patients care team.     C. Leita Sanders, MD Advanced Bronchoscopy [facility]    *This note was generated using voice recognition software/Dragon and/or AI transcription program.  Despite best efforts to proofread, errors can occur which can change the meaning. Any transcriptional errors that result from this process are unintentional and may not be fully corrected at the time of dictation.

## 2023-10-27 NOTE — Patient Instructions (Signed)
 Full PFT completed today ? ?

## 2023-10-27 NOTE — Progress Notes (Signed)
 Full PFT completed today ? ?

## 2023-10-27 NOTE — Patient Instructions (Signed)
 VISIT SUMMARY:  Today, you came in for a follow-up visit regarding your combined COPD and pulmonary fibrosis. You mentioned that your shortness of breath is manageable and that you have started a new medication regimen. We also discussed your flu vaccination status.  YOUR PLAN:  -COMBINED COPD AND PULMONARY FIBROSIS: You have a condition that affects your lungs, causing difficulty in breathing due to both chronic obstructive pulmonary disease (COPD) and scarring of the lung tissue (pulmonary fibrosis). Your lung function is stable, although there is some reduction in lung volume due to scarring. You should continue taking Trelegy as prescribed, make sure to rinse your mouth after using it, and monitor your response to the medication. Please report any issues you experience. We will have a follow-up appointment in four months to check on your progress.  -INFLUENZA VACCINATION: You received your flu shot today to help protect you from the influenza virus. This is especially important given your lung condition.  INSTRUCTIONS:  Please continue taking Trelegy as prescribed and rinse your mouth after each use. Monitor your response to the medication and report any issues. We will see you again in four months for a follow-up appointment.

## 2023-12-27 ENCOUNTER — Other Ambulatory Visit: Payer: Self-pay | Admitting: Internal Medicine

## 2024-02-29 ENCOUNTER — Ambulatory Visit: Admitting: Pulmonary Disease

## 2024-02-29 ENCOUNTER — Encounter: Payer: Self-pay | Admitting: Pulmonary Disease

## 2024-02-29 VITALS — BP 138/60 | HR 93 | Temp 97.9°F | Ht 70.0 in | Wt 216.0 lb

## 2024-02-29 DIAGNOSIS — R053 Chronic cough: Secondary | ICD-10-CM

## 2024-02-29 DIAGNOSIS — J439 Emphysema, unspecified: Secondary | ICD-10-CM | POA: Diagnosis not present

## 2024-02-29 DIAGNOSIS — Z87891 Personal history of nicotine dependence: Secondary | ICD-10-CM | POA: Diagnosis not present

## 2024-02-29 DIAGNOSIS — J8410 Pulmonary fibrosis, unspecified: Secondary | ICD-10-CM | POA: Diagnosis not present

## 2024-02-29 MED ORDER — AZITHROMYCIN 250 MG PO TABS: 250.0000 mg | ORAL_TABLET | ORAL | 3 refills | Status: AC

## 2024-02-29 MED ORDER — PREDNISONE 20 MG PO TABS: 20.0000 mg | ORAL_TABLET | Freq: Every day | ORAL | 0 refills | Status: AC

## 2024-02-29 NOTE — Patient Instructions (Addendum)
 VISIT SUMMARY:  During your visit, we discussed your chronic cough and its management, as well as your combined pulmonary fibrosis and emphysema. We reviewed your current medications and made some adjustments to help improve your symptoms.  YOUR PLAN:  -COMBINED PULMONARY FIBROSIS AND EMPHYSEMA: This is a chronic lung condition that involves scarring and damage to the lung tissue, making it difficult to breathe. We have prescribed azithromycin  to be taken three times a week (Monday, Wednesday, Friday) and a 5-day course of prednisone  to help clear congestion. You were also given samples of a stronger inhaler to try, and we have submitted a request for Ohtuvayre  through a specialty pharmacy for nebulizer use twice a day.  -CHRONIC COUGH SECONDARY TO ANTIHYPERTENSIVE MEDICATION: Your chronic cough is likely being caused or worsened by your current blood pressure medication which is in the class called ACE inhibitors. We have sent a note to your primary care physician to discontinue your current antihypertensive medication and switch to an alternative.  INSTRUCTIONS:  Please follow the prescribed medication regimen and use the inhaler and nebulizer as directed. We will follow up with you after the insurance approval for Ohtuvayre . Additionally, your primary care physician will be in touch regarding the change in your blood pressure medication.

## 2024-04-05 ENCOUNTER — Ambulatory Visit: Admitting: Dermatology

## 2024-05-01 ENCOUNTER — Ambulatory Visit: Admitting: Pulmonary Disease
